# Patient Record
Sex: Male | Born: 1945
Health system: Southern US, Community
[De-identification: ages and names within clinical notes are randomized; demographics above are authoritative.]

## PROBLEM LIST (undated history)

## (undated) DIAGNOSIS — M109 Gout, unspecified: Secondary | ICD-10-CM

## (undated) DIAGNOSIS — M1990 Unspecified osteoarthritis, unspecified site: Secondary | ICD-10-CM

## (undated) DIAGNOSIS — E119 Type 2 diabetes mellitus without complications: Secondary | ICD-10-CM

## (undated) DIAGNOSIS — I6529 Occlusion and stenosis of unspecified carotid artery: Secondary | ICD-10-CM

## (undated) DIAGNOSIS — E7800 Pure hypercholesterolemia, unspecified: Secondary | ICD-10-CM

## (undated) DIAGNOSIS — I10 Essential (primary) hypertension: Secondary | ICD-10-CM

## (undated) DIAGNOSIS — K429 Umbilical hernia without obstruction or gangrene: Secondary | ICD-10-CM

## (undated) DIAGNOSIS — T8859XA Other complications of anesthesia, initial encounter: Secondary | ICD-10-CM

## (undated) DIAGNOSIS — I2510 Atherosclerotic heart disease of native coronary artery without angina pectoris: Secondary | ICD-10-CM

## (undated) DIAGNOSIS — R0600 Dyspnea, unspecified: Secondary | ICD-10-CM

## (undated) DIAGNOSIS — C801 Malignant (primary) neoplasm, unspecified: Secondary | ICD-10-CM

## (undated) DIAGNOSIS — I219 Acute myocardial infarction, unspecified: Secondary | ICD-10-CM

## (undated) DIAGNOSIS — Z872 Personal history of diseases of the skin and subcutaneous tissue: Secondary | ICD-10-CM

## (undated) DIAGNOSIS — T4145XA Adverse effect of unspecified anesthetic, initial encounter: Secondary | ICD-10-CM

## (undated) HISTORY — DX: Pure hypercholesterolemia, unspecified: E78.00

## (undated) HISTORY — DX: Gout, unspecified: M10.9

## (undated) HISTORY — PX: TOE SURGERY: SHX1073

## (undated) HISTORY — DX: Atherosclerotic heart disease of native coronary artery without angina pectoris: I25.10

## (undated) HISTORY — PX: BACK SURGERY: SHX140

## (undated) HISTORY — DX: Occlusion and stenosis of unspecified carotid artery: I65.29

## (undated) HISTORY — DX: Type 2 diabetes mellitus without complications: E11.9

## (undated) HISTORY — DX: Essential (primary) hypertension: I10

## (undated) HISTORY — PX: ROTATOR CUFF REPAIR: SHX139

## (undated) HISTORY — PX: KNEE SURGERY: SHX244

---

## 1999-02-04 ENCOUNTER — Encounter: Payer: Self-pay | Admitting: Neurosurgery

## 1999-02-04 ENCOUNTER — Observation Stay (HOSPITAL_COMMUNITY): Admission: RE | Admit: 1999-02-04 | Discharge: 1999-02-05 | Payer: Self-pay | Admitting: Neurosurgery

## 2000-08-02 ENCOUNTER — Encounter: Payer: Self-pay | Admitting: Neurosurgery

## 2000-08-02 ENCOUNTER — Ambulatory Visit (HOSPITAL_COMMUNITY): Admission: RE | Admit: 2000-08-02 | Discharge: 2000-08-02 | Payer: Self-pay | Admitting: Neurosurgery

## 2002-07-27 HISTORY — PX: CORONARY ARTERY BYPASS GRAFT: SHX141

## 2002-07-27 HISTORY — PX: BYPASS GRAFT: SHX909

## 2003-03-02 ENCOUNTER — Encounter: Payer: Self-pay | Admitting: Internal Medicine

## 2003-03-02 ENCOUNTER — Inpatient Hospital Stay (HOSPITAL_COMMUNITY): Admission: EM | Admit: 2003-03-02 | Discharge: 2003-03-11 | Payer: Self-pay | Admitting: Emergency Medicine

## 2003-03-06 ENCOUNTER — Encounter: Payer: Self-pay | Admitting: Internal Medicine

## 2003-03-07 ENCOUNTER — Encounter: Payer: Self-pay | Admitting: Cardiothoracic Surgery

## 2003-03-08 ENCOUNTER — Encounter: Payer: Self-pay | Admitting: Cardiothoracic Surgery

## 2003-03-09 ENCOUNTER — Encounter: Payer: Self-pay | Admitting: Cardiothoracic Surgery

## 2003-04-16 ENCOUNTER — Encounter (HOSPITAL_COMMUNITY): Admission: RE | Admit: 2003-04-16 | Discharge: 2003-07-15 | Payer: Self-pay | Admitting: Internal Medicine

## 2003-07-16 ENCOUNTER — Encounter (HOSPITAL_COMMUNITY): Admission: RE | Admit: 2003-07-16 | Discharge: 2003-08-27 | Payer: Self-pay | Admitting: Internal Medicine

## 2004-12-02 LAB — HM COLONOSCOPY: HM Colonoscopy: NORMAL

## 2005-06-16 ENCOUNTER — Ambulatory Visit: Payer: Self-pay | Admitting: Cardiology

## 2005-06-23 ENCOUNTER — Ambulatory Visit: Payer: Self-pay | Admitting: Cardiology

## 2005-07-23 ENCOUNTER — Ambulatory Visit: Payer: Self-pay | Admitting: Cardiology

## 2005-07-27 DIAGNOSIS — D126 Benign neoplasm of colon, unspecified: Secondary | ICD-10-CM | POA: Insufficient documentation

## 2005-08-03 ENCOUNTER — Ambulatory Visit: Payer: Self-pay | Admitting: Gastroenterology

## 2005-08-06 ENCOUNTER — Ambulatory Visit: Payer: Self-pay | Admitting: Cardiology

## 2005-08-20 ENCOUNTER — Encounter (INDEPENDENT_AMBULATORY_CARE_PROVIDER_SITE_OTHER): Payer: Self-pay | Admitting: Specialist

## 2005-08-20 ENCOUNTER — Ambulatory Visit: Payer: Self-pay | Admitting: Gastroenterology

## 2006-05-12 ENCOUNTER — Ambulatory Visit: Payer: Self-pay | Admitting: Cardiology

## 2006-05-12 ENCOUNTER — Ambulatory Visit: Payer: Self-pay

## 2007-04-08 ENCOUNTER — Encounter: Payer: Self-pay | Admitting: Internal Medicine

## 2007-07-15 ENCOUNTER — Ambulatory Visit: Payer: Self-pay

## 2007-07-15 ENCOUNTER — Ambulatory Visit: Payer: Self-pay | Admitting: Cardiology

## 2008-07-05 ENCOUNTER — Ambulatory Visit: Payer: Self-pay

## 2008-07-05 ENCOUNTER — Ambulatory Visit: Payer: Self-pay | Admitting: Cardiology

## 2008-07-10 ENCOUNTER — Ambulatory Visit: Payer: Self-pay

## 2008-08-15 ENCOUNTER — Ambulatory Visit: Payer: Self-pay | Admitting: Cardiology

## 2008-08-15 LAB — CONVERTED CEMR LAB
Bilirubin, Direct: 0.1 mg/dL (ref 0.0–0.3)
HDL: 39.7 mg/dL (ref >39.0)
Total Bilirubin: 0.9 mg/dL (ref 0.3–1.2)
Total CHOL/HDL Ratio: 2.8
VLDL: 19 mg/dL (ref 0–40)

## 2009-03-29 ENCOUNTER — Encounter: Admission: RE | Admit: 2009-03-29 | Discharge: 2009-03-29 | Payer: Self-pay | Admitting: Neurosurgery

## 2009-04-19 ENCOUNTER — Encounter: Admission: RE | Admit: 2009-04-19 | Discharge: 2009-04-19 | Payer: Self-pay | Admitting: Neurosurgery

## 2009-04-22 ENCOUNTER — Encounter (INDEPENDENT_AMBULATORY_CARE_PROVIDER_SITE_OTHER): Payer: Self-pay | Admitting: *Deleted

## 2009-08-01 ENCOUNTER — Encounter: Payer: Self-pay | Admitting: Cardiology

## 2009-08-01 DIAGNOSIS — I10 Essential (primary) hypertension: Secondary | ICD-10-CM | POA: Insufficient documentation

## 2009-08-01 DIAGNOSIS — M109 Gout, unspecified: Secondary | ICD-10-CM

## 2009-08-01 DIAGNOSIS — I2510 Atherosclerotic heart disease of native coronary artery without angina pectoris: Secondary | ICD-10-CM | POA: Insufficient documentation

## 2009-08-01 DIAGNOSIS — I679 Cerebrovascular disease, unspecified: Secondary | ICD-10-CM | POA: Insufficient documentation

## 2009-08-01 DIAGNOSIS — E785 Hyperlipidemia, unspecified: Secondary | ICD-10-CM | POA: Insufficient documentation

## 2009-08-02 ENCOUNTER — Ambulatory Visit: Payer: Self-pay

## 2009-08-02 ENCOUNTER — Encounter: Payer: Self-pay | Admitting: Cardiovascular Disease

## 2009-08-02 ENCOUNTER — Ambulatory Visit: Payer: Self-pay | Admitting: Cardiology

## 2009-09-20 ENCOUNTER — Ambulatory Visit: Payer: Self-pay | Admitting: Cardiology

## 2009-09-20 ENCOUNTER — Encounter (INDEPENDENT_AMBULATORY_CARE_PROVIDER_SITE_OTHER): Payer: Self-pay | Admitting: *Deleted

## 2009-09-20 LAB — CONVERTED CEMR LAB
BUN: 8 mg/dL (ref 6–23)
CO2: 27 meq/L (ref 19–32)
Glucose, Bld: 179 mg/dL — ABNORMAL HIGH (ref 70–99)
HDL: 45.2 mg/dL (ref >39.00)
LDL Cholesterol: 27 mg/dL (ref 0–99)
Potassium: 3.9 meq/L (ref 3.5–5.1)
Sodium: 138 meq/L (ref 135–145)
Total Bilirubin: 0.7 mg/dL (ref 0.3–1.2)
Total CHOL/HDL Ratio: 2
VLDL: 24.2 mg/dL (ref 0.0–40.0)

## 2009-09-25 ENCOUNTER — Telehealth (INDEPENDENT_AMBULATORY_CARE_PROVIDER_SITE_OTHER): Payer: Self-pay | Admitting: *Deleted

## 2009-10-04 ENCOUNTER — Encounter: Payer: Self-pay | Admitting: Cardiology

## 2010-01-17 ENCOUNTER — Telehealth (INDEPENDENT_AMBULATORY_CARE_PROVIDER_SITE_OTHER): Payer: Self-pay | Admitting: *Deleted

## 2010-04-17 ENCOUNTER — Telehealth (INDEPENDENT_AMBULATORY_CARE_PROVIDER_SITE_OTHER): Payer: Self-pay | Admitting: *Deleted

## 2010-04-18 IMAGING — CT CT L SPINE W/ CM
4 of 11 series · 10 of 33 positions shown, 12 images · IV contrast (omnipaque)
Comparison: 03/29/2009

MYELOGRAM  INJECTION
TECHNIQUE: Informed consent was obtained from the patient prior to
the procedure, including potential complications of headache,
allergy, infection and pain. Specific instructions were given
regarding 24 hour bedrest post procedure to prevent post-LP
headache.  A timeout procedure was performed.  With the patient
prone, the lower back was prepped with Betadine.  1% Lidocaine was
used for local anesthesia.  Lumbar puncture was performed by the
radiologist at the L4-5 level using a 22 gauge needle with return
of clear CSF.  15 cc of Omnipaque 180 was injected into the
subarachnoid space .
CLINICAL DATA: Low back and left leg pain.  Previous lumbar
discectomy at L4-5, right many years ago.
TECHNIQUE: Multidetector CT imaging of the lumbar spine was
performed following myelography.  Multiplanar CT image
reconstructions were also generated.

[Series 2: l spine · axial · 0.27mm/px · z∈[-182,-110]mm · 2 of 87 slices shown, 3 images]
[im 29/87  soft-tissue]
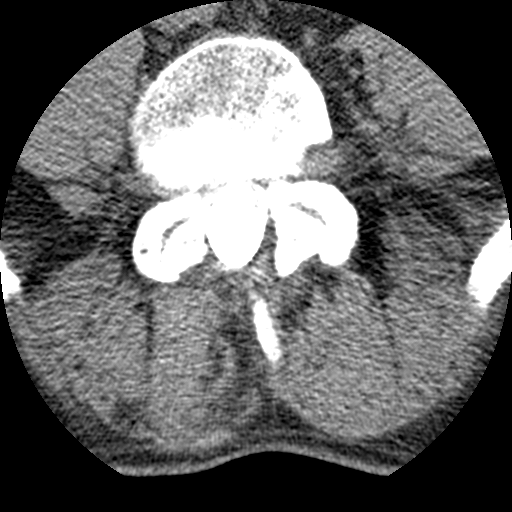
[im 29/87  bone]
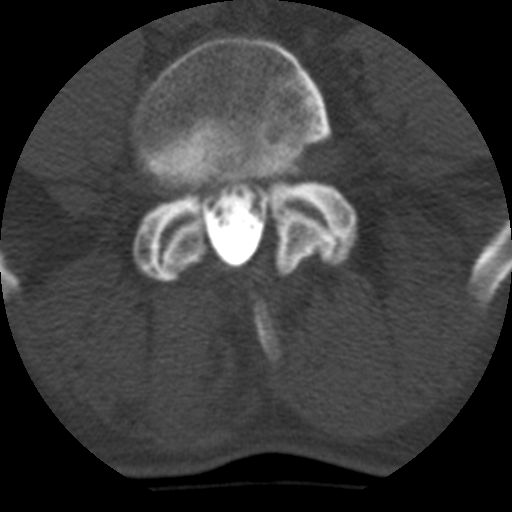
[im 58/87  bone]
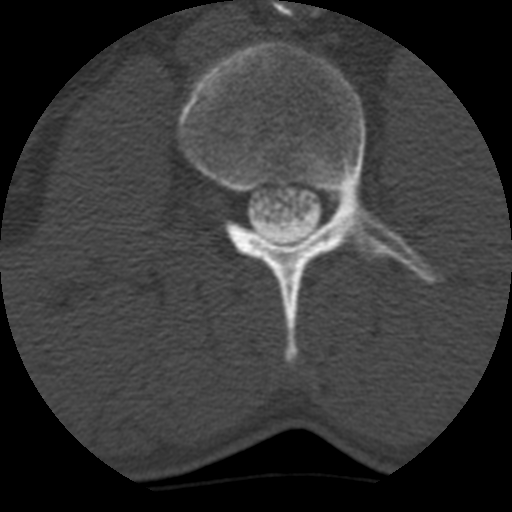

[Series 3: bone windows · axial · 0.27mm/px · z∈[-182,-110]mm · 2 of 87 slices shown]
[im 29/87  bone]
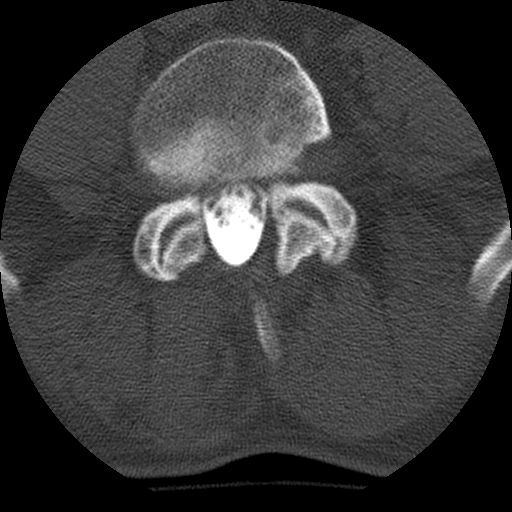
[im 58/87  bone]
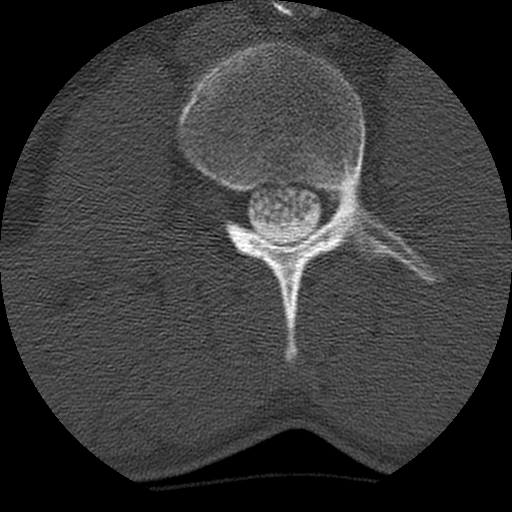

[Series 401: cor lower l-spine · coronal · 0.43mm/px · 1 of 45 slices shown]
[im 23/45  bone]
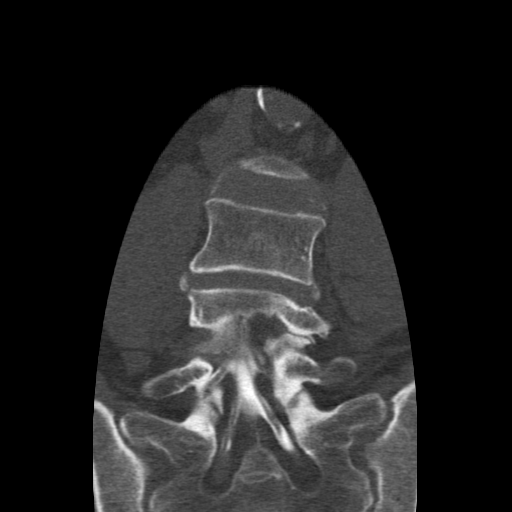

[Series 402: sag l-spine · sagittal · 0.43mm/px · 5 of 45 slices shown, 6 images]
[im 15/45  bone]
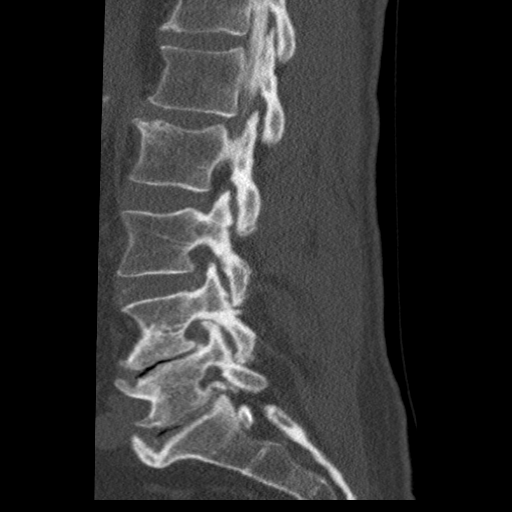
[im 19/45  bone]
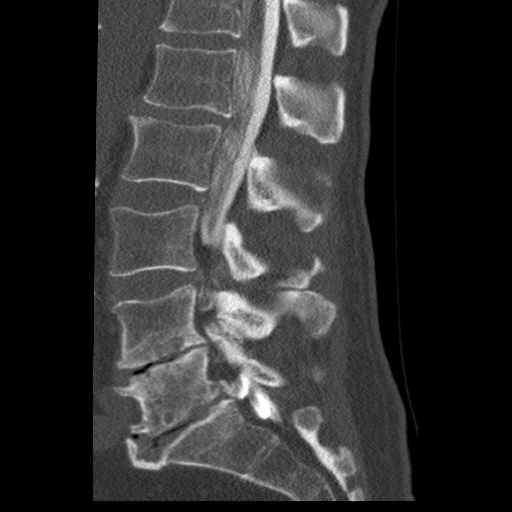
[im 23/45  soft-tissue]
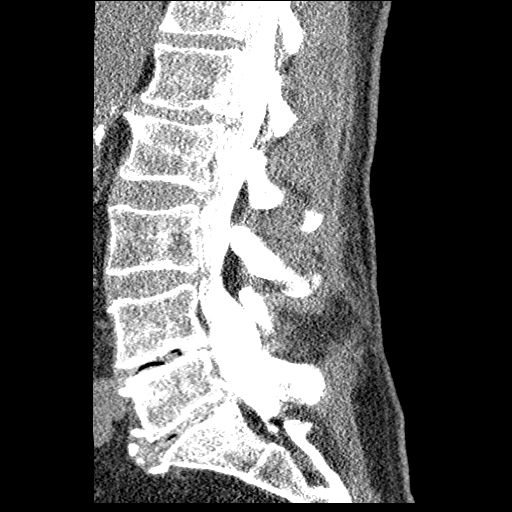
[im 23/45  bone]
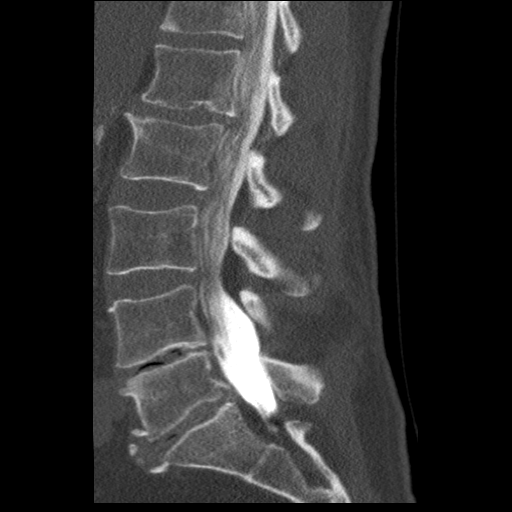
[im 26/45  bone]
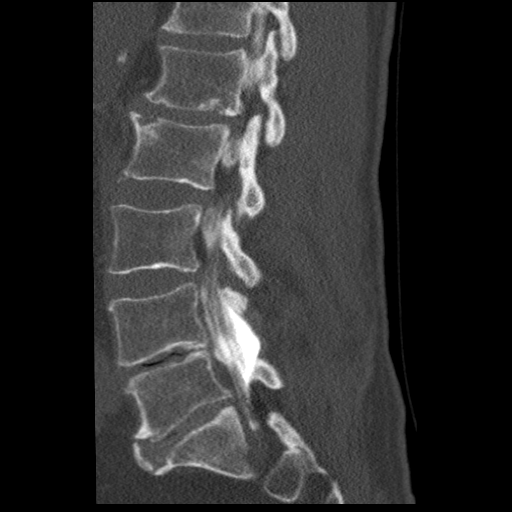
[im 30/45  bone]
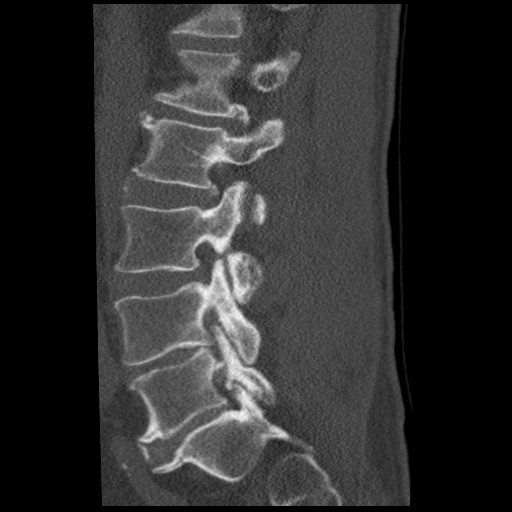

[10 of 33 positions shown; findings below may reference images not displayed]

IMPRESSION: Successful injection of  intrathecal contrast for myelography.

MYELOGRAM LUMBAR
FINDINGS: Anatomic alignment is present.  There is moderate to
severe disc space narrowing at L4-5.  Mild to moderate disc space
narrowing is seen at L1-2 and L5-S1. Conus medullaris is normal.

There is mild degenerative scoliosis convex right at the L4-5
level.  This relates to asymmetric loss of interspace height at L4-
5 on the left.  There appears to be mild lateral recess stenosis at
L4-5, left, slightly deflecting the left L5 nerve root.

Moderate to severe stenosis is present at L3-4, particularly with
the patient standing.  This relates to posterior element
hypertrophy and central disc protrusion.  Bilateral L4 nerve root
encroachment is present, worse on the right again with the patient
standing.

Standing lateral flexion/extension views show no dynamic
instability.

At the L5-S1 level, both S1 nerve roots appear to fill normally.
There is moderate posterior osseous ridging/calcific disc material
at this level.

Fluoroscopy Time: 1.22 minutes
IMPRESSION: As above.

CT MYELOGRAPHY LUMBAR SPINE
FINDINGS: No prevertebral or paraspinous masses.  Moderate
atherosclerotic calcification of the aorta.

L1-2: Shallow central protrusion with large calcified
extraforaminal disc herniation on the right.  No L2 nerve root
encroachment the canal.  The right L1 nerve root appears at risk.

L2-3: Mild annular bulging.  Mild facet arthropathy.  No
compressive lesion.

L3-4: Moderate central canal stenosis secondary to posterior
element hypertrophy and shallow central protrusion/annular bulge.
Mild bilateral neural foraminal narrowing.  Left greater than right
L4 nerve root encroachment is identified with the patient recumbent
for CT.  Myelographically the changes are worse on the right at
this level.

L4-5: Hemilaminectomy defect, right without evidence for recurrent
protrusion.  Moderate facet arthropathy, left greater than right.
Diffuse loss of disc height, worse on the left, associated with
circumferential osteophyte formation. Mild left L5 nerve root
encroachment the lateral recess.  No evidence for recurrent right-
sided disc pathology.  Bilateral neural foraminal narrowing,
slightly worse on the left, potentially affects the left L4 nerve
root.

L5-S1: Central and leftward calcific protrusion associated disc
space narrowing and facet arthropathy.  The left S1 nerve root is
not significantly compressed by this disc/osteophyte complex.
Calcific disc material does extend into the foramen where facet
arthropathy combines to result in moderate foraminal narrowing,
although clear-cut left L5 nerve root encroachment is not
established.  There is no significant foraminal narrowing on the
right.

Compared to the prior MR, there is fairly good agreement.  There is
no clear-cut dominant left-sided pathology.  The disc/osteophyte
complex L5-S1 on the left is fairly prominent but no nerve root cut
off affects the S1 nerve root either on CT or myelographically.
With the patient standing there is moderate stenosis at L3-4 but
this does not lateralize to the left myelographically.  On CT
however there is left greater than right effacement of the L4 nerve
roots with the patient recumbent.  The pathology at L4-5 affecting
the L4 and L5 nerve roots on the left is relatively modest.
IMPRESSION: Calcified central and leftward protrusion/osteophyte at L5-S1.
With regard to the clinical question, there is no evidence for left
S1 nerve root cut off.

Asymmetric foraminal narrowing at L4-5, left and L5-S1, left
potentially irritate the left L4 and left L5 nerve roots
respectively, as suggested from prior MR.

Mild asymmetric lateral recess encroachment left at L4-5.

Moderate stenosis at L3-4, particularly with the patient standing.
Bilateral L4 nerve root encroachment is present at this level,
slightly worse on the left with the patient recumbent for CT.

No dynamic instability.

## 2010-07-31 ENCOUNTER — Ambulatory Visit
Admission: RE | Admit: 2010-07-31 | Discharge: 2010-07-31 | Payer: Self-pay | Source: Home / Self Care | Attending: Cardiology | Admitting: Cardiology

## 2010-07-31 ENCOUNTER — Encounter: Payer: Self-pay | Admitting: Cardiology

## 2010-08-01 ENCOUNTER — Other Ambulatory Visit: Payer: Self-pay | Admitting: Cardiology

## 2010-08-01 ENCOUNTER — Ambulatory Visit
Admission: RE | Admit: 2010-08-01 | Discharge: 2010-08-01 | Payer: Self-pay | Source: Home / Self Care | Attending: Cardiology | Admitting: Cardiology

## 2010-08-01 DIAGNOSIS — E7800 Pure hypercholesterolemia, unspecified: Secondary | ICD-10-CM | POA: Insufficient documentation

## 2010-08-01 LAB — LIPID PANEL
Cholesterol: 173 mg/dL (ref 0–200)
HDL: 47.6 mg/dL (ref >39.00)
LDL Cholesterol: 94 mg/dL (ref 0–99)
Total CHOL/HDL Ratio: 4
Triglycerides: 158 mg/dL — ABNORMAL HIGH (ref 0.0–149.0)
VLDL: 31.6 mg/dL (ref 0.0–40.0)

## 2010-08-01 LAB — HEPATIC FUNCTION PANEL
ALT: 33 U/L (ref 0–53)
AST: 27 U/L (ref 0–37)
Albumin: 4 g/dL (ref 3.5–5.2)
Alkaline Phosphatase: 26 U/L — ABNORMAL LOW (ref 39–117)
Bilirubin, Direct: 0.2 mg/dL (ref 0.0–0.3)
Total Bilirubin: 1 mg/dL (ref 0.3–1.2)
Total Protein: 6.9 g/dL (ref 6.0–8.3)

## 2010-08-21 ENCOUNTER — Ambulatory Visit: Admission: RE | Admit: 2010-08-21 | Discharge: 2010-08-21 | Payer: Self-pay | Source: Home / Self Care

## 2010-08-21 ENCOUNTER — Encounter: Payer: Self-pay | Admitting: Cardiology

## 2010-08-26 NOTE — Progress Notes (Signed)
   Walk in Patient Form Recieved " pt left [employer] paper to be filled out" sent to Message Nurse [facility]  September 25, 2009 9:27 AM

## 2010-08-26 NOTE — Letter (Signed)
Summary: Disability Benefits Questionaire  Disability Benefits Questionaire   Imported By: Marylou Mccoy 12/27/2009 09:43:19  _____________________________________________________________________  External Attachment:    Type:   Image     Comment:   External Document

## 2010-08-26 NOTE — Assessment & Plan Note (Signed)
Summary: [id number]/fkw  Medications Added METOPROLOL SUCCINATE 50 MG XR24H-TAB (METOPROLOL SUCCINATE) Take one tablet by mouth daily AMLODIPINE BESYLATE 5 MG TABS (AMLODIPINE BESYLATE) Take one tablet by mouth daily AMLODIPINE BESYLATE 10 MG TABS (AMLODIPINE BESYLATE) Take one tablet by mouth daily TERAZOSIN HCL 5 MG CAPS (TERAZOSIN HCL) 1 tab by mouth once daily FISH OIL   OIL (FISH OIL) 2 tabs by mouth once daily PREDNISONE 5 MG TABS (PREDNISONE) 1 tab by mouth once daily * HYDROCORTI/NYSTAIN mouth wash at bedtime ASPIRIN EC 325 MG TBEC (ASPIRIN) Take one tablet by mouth daily CRESTOR 10 MG TABS (ROSUVASTATIN CALCIUM) Take one tablet by mouth daily.        CC:  pt states he has started taken prednisone that expired in 2003 or 2005 because he was having pain .  History of Present Illness: Francisco Howe is a pleasant 65 year old gentleman who has a history of coronary artery disease, status post coronary artery bypassing graft in 2004.  I last saw him on July 14, 2008.  Last Myoview was performed on July 10, 2008. There is no scar or ischemia and his ejection fraction was 59%. Left carotid Dopplers in December of 2009 showed a 0-39% right and a 40-59% left stenosis. There was a 20 mm mercury difference in blood pressure with possible right subclavian steal. Followup was recommended in one year. Abdominal ultrasound in December of 2008 showed no aneurysm. Since I last saw him he has developed significant low back and left hip and lower extremity pain. This is similar to previous disc pain. Note he denies any chest pain. He has not been able to exercise in the past 3 months and therefore does note some increased weight and dyspnea. There is no orthopnea, PND or pedal edema. There is no syncope. There is some question about whether he can have steroid injections for his back issue.  Current Medications (verified): 1)  Metoprolol Succinate 50 Mg Xr24h-Tab (Metoprolol Succinate) .... Take One Tablet By  Mouth Daily 2)  Amlodipine Besylate 5 Mg Tabs (Amlodipine Besylate) .... Take One Tablet By Mouth Daily 3)  Terazosin Hcl 5 Mg Caps (Terazosin Hcl) .Marland Kitchen.. 1 Tab By Mouth Once Daily 4)  Fish Oil   Oil (Fish Oil) .... 2 Tabs By Mouth Once Daily 5)  Prednisone 5 Mg Tabs (Prednisone) .Marland Kitchen.. 1 Tab By Mouth Once Daily 6)  Hydrocorti/nystain .... Mouth Wash At Bedtime  Past History:  Past Medical History: Reviewed history from 08/01/2009 and no changes required. Current Problems:  CAD (ICD-414.00) CEREBROVASCULAR DISEASE (ICD-437.9) HYPERLIPIDEMIA (ICD-272.4) HYPERTENSION (ICD-401.9) GOUT (ICD-274.9)  Past Surgical History: SURGICAL PROCEDURE:  Coronary artery bypass grafting x5 with the left  internal mammary artery to the diagonal coronary artery (dominant vessel of  anterior wall), left radial bypass to the first obtuse marginal, reverse  saphenous vein graft sequentially to the second obtuse marginal and distal  circumflex, reverse saphenous vein graft to the right coronary artery with  left radial artery harvesting and right endovein harvesting.  SURGEON:  Gwenith Daily. Tyrone Sage, M.D. EBG/MEDQ  D:  03/10/2003  T:  03/10/2003  Job:  [id number] History of back surgery Left shoulder arthroscopic surgery  Social History: Reviewed history from 08/01/2009 and no changes required.  The patient is married and divorced, he has one daughter;  he uses alcohol occasionally. Former tobacco use.   He is a retired Theatre stage manager.  Review of Systems       Significant problems with low back pain and left lower extremity  pain but no fevers or chills, productive cough, hemoptysis, dysphasia, odynophagia, melena, hematochezia, dysuria, hematuria, rash, seizure activity, orthopnea, PND, pedal edema, claudication. Remaining systems are negative.   Vital Signs:  Patient profile:   65 year old male Height:      72 inches Weight:      238 pounds BMI:     32.40 Pulse rate:   81 / minute Resp:     12 per  minute BP sitting:   150 / 100  (left arm)  Vitals Entered By: Francisco Howe (August 02, 2009 2:28 PM)  Physical Exam  General:  Well-developed well-nourished in no acute distress.  Skin is warm and dry.  HEENT is normal.  Neck is supple. No thyromegaly. right carotid bruit Chest is clear to auscultation with normal expansion.  Cardiovascular exam is regular rate and rhythm.  Abdominal exam nontender or distended. No masses palpated. Extremities show no edema. neuro grossly intact    EKG  Procedure date:  08/02/2009  Findings:      Sinus rhythm at a rate of 81. Left axis deviation. Poor R wave progression. No ST changes.  Impression & Recommendations:  Problem # 1:  CAD (ICD-414.00)  Patient doing well from a symptomatic standpoint. Plan continue aspirin, beta blocker and resume statin. Continue risk factor modification. There is no contraindication to injections for his back pain. If aspirin needs to be discontinued transiently then this could be performed. There is no contraindication to this.  His updated medication list for this problem includes:    Metoprolol Succinate 50 Mg Xr24h-tab (Metoprolol succinate) .Marland Kitchen... Take one tablet by mouth daily    Amlodipine Besylate 10 Mg Tabs (Amlodipine besylate) .Marland Kitchen... Take one tablet by mouth daily    Aspirin Ec 325 Mg Tbec (Aspirin) .Marland Kitchen... Take one tablet by mouth daily  Problem # 2:  CEREBROVASCULAR DISEASE (ICD-437.9) Continue aspirin and resume statin. Await followup carotid Dopplers performed today.  Problem # 3:  HYPERLIPIDEMIA (ICD-272.4)  Resume statin. He felt that his back pain Renken have been statin related but it did not improve after discontinuing. I think is more likely a disc problem. Check lipids and liver.  His updated medication list for this problem includes:    Crestor 10 Mg Tabs (Rosuvastatin calcium) .Marland Kitchen... Take one tablet by mouth daily.  Problem # 4:  HYPERTENSION (ICD-401.9)  His blood pressure is  elevated today. Increase amlodipine to 10 mg p.o. daily. Check bmet.  His updated medication list for this problem includes:    Metoprolol Succinate 50 Mg Xr24h-tab (Metoprolol succinate) .Marland Kitchen... Take one tablet by mouth daily    Amlodipine Besylate 10 Mg Tabs (Amlodipine besylate) .Marland Kitchen... Take one tablet by mouth daily    Terazosin Hcl 5 Mg Caps (Terazosin hcl) .Marland Kitchen... 1 tab by mouth once daily    Aspirin Ec 325 Mg Tbec (Aspirin) .Marland Kitchen... Take one tablet by mouth daily  Problem # 5:  GOUT (ICD-274.9)  Patient Instructions: 1)  Your physician recommends that you schedule a follow-up appointment in: ONE YEAR 2)  Your physician has recommended you make the following change in your medication: RESTART CRESTOR 10MG  ONE TABLET ONCE DAILY 3)  INCREASE AMLODIPINE TO 10MG  ONE TABLET ONCE DAILY 4)  Your physician recommends that you return for lab work in: 6 WEEKS Prescriptions: AMLODIPINE BESYLATE 10 MG TABS (AMLODIPINE BESYLATE) Take one tablet by mouth daily  #90 x 4   Entered by:   Deliah Goody, RN   Authorized by:  Ferman Hamming, MD, [facility]   Signed by:   Deliah Goody, RN on 08/02/2009   Method used:   Print then Give to Patient   RxID:   [id number] CRESTOR 10 MG TABS (ROSUVASTATIN CALCIUM) Take one tablet by mouth daily.  #90 x 4   Entered by:   Deliah Goody, RN   Authorized by:   Ferman Hamming, MD, [facility]   Signed by:   Deliah Goody, RN on 08/02/2009   Method used:   Print then Give to Patient   RxID:   [id number]

## 2010-08-26 NOTE — Letter (Signed)
Summary: Custom - Lipid  [facility], Main Office  [address]   [place], [place] [address]   Phone: [phone]  Fax: [phone]     September 20, 2009 MRN: [id number]   Francisco Howe [address] [place], [place]  [address]   Dear Mr. LUKACS,  We have reviewed your cholesterol results.  They are as follows:     Total Cholesterol:    96 (Desirable: less than 200)       HDL  Cholesterol:     45.20  (Desirable: greater than 40 for men and 50 for women)       LDL Cholesterol:       27  (Desirable: less than 100 for low risk and less than 70 for moderate to high risk)       Triglycerides:       121.0  (Desirable: less than 150)  Our recommendations include:These numbers look good. Continue on the same medicine. Sodium, potassium, kidney and Liver function are normal. Take care, Dr. Darel Hong.    Call our office at the number listed above if you have any questions.  Lowering your LDL cholesterol is important, but it is only one of a large number of "risk factors" that Sadik indicate that you are at risk for heart disease, stroke or other complications of hardening of the arteries.  Other risk factors include:   A.  Cigarette Smoking* B.  High Blood Pressure* C.  Obesity* D.   Low HDL Cholesterol (see yours above)* E.   Diabetes Mellitus (higher risk if your is uncontrolled) F.  Family history of premature heart disease G.  Previous history of stroke or cardiovascular disease    *These are risk factors YOU HAVE CONTROL OVER.  For more information, visit .  There is now evidence that lowering the TOTAL CHOLESTEROL AND LDL CHOLESTEROL can reduce the risk of heart disease.  The American Heart Association recommends the following guidelines for the treatment of elevated cholesterol:  1.  If there is now current heart disease and less than two risk factors, TOTAL CHOLESTEROL should be less than 200 and LDL CHOLESTEROL should be less than 100. 2.  If there  is current heart disease or two or more risk factors, TOTAL CHOLESTEROL should be less than 200 and LDL CHOLESTEROL should be less than 70.  A diet low in cholesterol, saturated fat, and calories is the cornerstone of treatment for elevated cholesterol.  Cessation of smoking and exercise are also important in the management of elevated cholesterol and preventing vascular disease.  Studies have shown that 30 to 60 minutes of physical activity most days can help lower blood pressure, lower cholesterol, and keep your weight at a healthy level.  Drug therapy is used when cholesterol levels do not respond to therapeutic lifestyle changes (smoking cessation, diet, and exercise) and remains unacceptably high.  If medication is started, it is important to have you levels checked periodically to evaluate the need for further treatment options.  Thank you,    [employer] Team

## 2010-08-26 NOTE — Progress Notes (Signed)
   Request recieved from Dept of [facility] sent to [facility]  April 17, 2010 9:08 AM

## 2010-08-26 NOTE — Progress Notes (Signed)
   Pt filled Out ROI, I faxed his Stress over to [facility] Medical attn Dr.Bigi  [facility]  January 17, 2010 2:29 PM

## 2010-08-26 NOTE — Miscellaneous (Signed)
Summary: Orders Update  Clinical Lists Changes  Orders: Added new Test order of Carotid Duplex (Carotid Duplex) - Signed 

## 2010-08-28 NOTE — Assessment & Plan Note (Signed)
Summary: F1Y/DM  Medications Added METOPROLOL TARTRATE 25 MG TABS (METOPROLOL TARTRATE) Take one tablet by mouth twice a day PRAVASTATIN SODIUM 20 MG TABS (PRAVASTATIN SODIUM) Take one tablet by mouth daily at bedtime METFORMIN HCL 500 MG TABS (METFORMIN HCL) 1 tab by mouth two times a day MULTIVITAMINS   TABS (MULTIPLE VITAMIN) 1 tab by mouth once daily RA ZINC 50 MG TABS (ZINC GLUCONATE) 1 tab by mouth once daily * VIT B 12 daily * COQ10 1 tab by mouth once daily CLOBETASOL PROPIONATE 0.05 % CREA (CLOBETASOL PROPIONATE) as directed TACROLIMUS 1 MG CAPS (TACROLIMUS) as directed rinse tid HYDROCODONE-ACETAMINOPHEN 5-500 MG TABS (HYDROCODONE-ACETAMINOPHEN) as needed        History of Present Illness: Mr. Francisco Howe is a pleasant 65 year old gentleman who has a history of coronary artery disease, status post coronary artery bypassing graft in 2004.  I last saw him in Jan 2011.  Last Myoview was performed on July 10, 2008. There is no scar or ischemia and his ejection fraction was 59%. Last carotid Dopplers in Jan 2011 showed a 0-39% right and a 40-59% left stenosis. Abdominal ultrasound in December of 2008 showed no aneurysm. Since I last saw him, the patient denies any dyspnea on exertion, orthopnea, PND, pedal edema, palpitations, syncope or chest pain.   Current Medications (verified): 1)  Metoprolol Succinate 50 Mg Xr24h-Tab (Metoprolol Succinate) .... Take One Tablet By Mouth Daily 2)  Amlodipine Besylate 10 Mg Tabs (Amlodipine Besylate) .... Take One Tablet By Mouth Daily 3)  Terazosin Hcl 5 Mg Caps (Terazosin Hcl) .Marland Kitchen.. 1 Tab By Mouth Once Daily 4)  Fish Oil   Oil (Fish Oil) .... 2 Tabs By Mouth Once Daily 5)  Aspirin Ec 325 Mg Tbec (Aspirin) .... Take One Tablet By Mouth Daily 6)  Pravastatin Sodium 20 Mg Tabs (Pravastatin Sodium) .... Take One Tablet By Mouth Daily At Bedtime 7)  Metformin Hcl 500 Mg Tabs (Metformin Hcl) .Marland Kitchen.. 1 Tab By Mouth Two Times A Day 8)  Multivitamins   Tabs  (Multiple Vitamin) .Marland Kitchen.. 1 Tab By Mouth Once Daily 9)  Ra Zinc 50 Mg Tabs (Zinc Gluconate) .Marland Kitchen.. 1 Tab By Mouth Once Daily 10)  Vit B 12 .... Daily 11)  Coq10 .Marland Kitchen.. 1 Tab By Mouth Once Daily 12)  Clobetasol Propionate 0.05 % Crea (Clobetasol Propionate) .... As Directed 13)  Tacrolimus 1 Mg Caps (Tacrolimus) .... As Directed Rinse Tid 14)  Hydrocodone-Acetaminophen 5-500 Mg Tabs (Hydrocodone-Acetaminophen) .... As Needed  Past History:  Past Medical History: CAD (ICD-414.00) CEREBROVASCULAR DISEASE (ICD-437.9) HYPERLIPIDEMIA (ICD-272.4) HYPERTENSION (ICD-401.9) GOUT (ICD-274.9)  Past Surgical History: Reviewed history from 08/02/2009 and no changes required. SURGICAL PROCEDURE:  Coronary artery bypass grafting x5 with the left  internal mammary artery to the diagonal coronary artery (dominant vessel of  anterior wall), left radial bypass to the first obtuse marginal, reverse  saphenous vein graft sequentially to the second obtuse marginal and distal  circumflex, reverse saphenous vein graft to the right coronary artery with  left radial artery harvesting and right endovein harvesting.  SURGEON:  Gwenith Daily. Tyrone Sage, M.D. EBG/MEDQ  D:  03/10/2003  T:  03/10/2003  Job:  [id number] History of back surgery Left shoulder arthroscopic surgery  Social History: Reviewed history from 08/02/2009 and no changes required.  The patient is married and divorced, he has one daughter;  he uses alcohol occasionally. Former tobacco use.   He is a retired Theatre stage manager.  Review of Systems       no fevers  or chills, productive cough, hemoptysis, dysphasia, odynophagia, melena, hematochezia, dysuria, hematuria, rash, seizure activity, orthopnea, PND, pedal edema, claudication. Remaining systems are negative.   Vital Signs:  Patient profile:   65 year old male Height:      72 inches Weight:      238 pounds BMI:     32.40 Pulse rate:   77 / minute Resp:     12 per minute BP sitting:   138 / 80  (left  arm)  Vitals Entered By: Kem Parkinson (July 31, 2010 2:05 PM)  Physical Exam  General:  Well-developed well-nourished in no acute distress.  Skin is warm and dry.  HEENT is normal.  Neck is supple. No thyromegaly.  Chest is clear to auscultation with normal expansion.  Cardiovascular exam is regular rate and rhythm.  Abdominal exam nontender or distended. No masses palpated. Extremities show no edema. neuro grossly intact    EKG  Procedure date:  07/31/2010  Findings:      Sinus rhythm, left axis deviation, cannot rule out prior inferior infarct.  Impression & Recommendations:  Problem # 1:  CAD (ICD-414.00)  Continue aspirin, beta blocker and statin. Plan myoview when he returns in one year. His updated medication list for this problem includes:    Metoprolol Succinate 50 Mg Xr24h-tab (Metoprolol succinate) .Marland Kitchen... Take one tablet by mouth daily    Amlodipine Besylate 10 Mg Tabs (Amlodipine besylate) .Marland Kitchen... Take one tablet by mouth daily    Aspirin Ec 325 Mg Tbec (Aspirin) .Marland Kitchen... Take one tablet by mouth daily  Problem # 2:  CEREBROVASCULAR DISEASE (ICD-437.9)  Continue aspirin and statin. Schedule followup carotid Dopplers.  Orders: Carotid Duplex (Carotid Duplex)  Problem # 3:  HYPERLIPIDEMIA (ICD-272.4)  Continue statin. Check lipids and liver. The following medications were removed from the medication list:    Crestor 10 Mg Tabs (Rosuvastatin calcium) .Marland Kitchen... Take one tablet by mouth daily. His updated medication list for this problem includes:    Pravastatin Sodium 20 Mg Tabs (Pravastatin sodium) .Marland Kitchen... Take one tablet by mouth daily at bedtime  The following medications were removed from the medication list:    Crestor 10 Mg Tabs (Rosuvastatin calcium) .Marland Kitchen... Take one tablet by mouth daily. His updated medication list for this problem includes:    Pravastatin Sodium 20 Mg Tabs (Pravastatin sodium) .Marland Kitchen... Take one tablet by mouth daily at bedtime  Problem #  4:  HYPERTENSION (ICD-401.9) Continue present blood pressure medications. Check potassium and renal function. His updated medication list for this problem includes:    Metoprolol Succinate 50 Mg Xr24h-tab (Metoprolol succinate) .Marland Kitchen... Take one tablet by mouth daily    Amlodipine Besylate 10 Mg Tabs (Amlodipine besylate) .Marland Kitchen... Take one tablet by mouth daily    Terazosin Hcl 5 Mg Caps (Terazosin hcl) .Marland Kitchen... 1 tab by mouth once daily    Aspirin Ec 325 Mg Tbec (Aspirin) .Marland Kitchen... Take one tablet by mouth daily  His updated medication list for this problem includes:    Metoprolol Tartrate 25 Mg Tabs (Metoprolol tartrate) .Marland Kitchen... Take one tablet by mouth twice a day    Amlodipine Besylate 10 Mg Tabs (Amlodipine besylate) .Marland Kitchen... Take one tablet by mouth daily    Terazosin Hcl 5 Mg Caps (Terazosin hcl) .Marland Kitchen... 1 tab by mouth once daily    Aspirin Ec 325 Mg Tbec (Aspirin) .Marland Kitchen... Take one tablet by mouth daily  Problem # 5:  GOUT (ICD-274.9)  Patient Instructions: 1)  Your physician has recommended you make the  following change in your medication: CHANGE TO METOPROLOL TARTRATE 25MG  ONE TABLET TWICE DAILY 2)  Your physician wants you to follow-up in:ONE YEAR  You will receive a reminder letter in the mail two months in advance. If you don't receive a letter, please call our office to schedule the follow-up appointment. 3)  Your physician has requested that you have a carotid duplex. This test is an ultrasound of the carotid arteries in your neck. It looks at blood flow through these arteries that supply the brain with blood. Allow one hour for this exam. There are no restrictions or special instructions. Prescriptions: METOPROLOL TARTRATE 25 MG TABS (METOPROLOL TARTRATE) Take one tablet by mouth twice a day  #60 x 12   Entered by:   Deliah Goody, RN   Authorized by:   Ferman Hamming, MD, [facility]   Signed by:   Deliah Goody, RN on 07/31/2010   Method used:   Electronically to        [facility]  (retail)       [address]       [place], [place]  [address]       Ph: [phone]       Fax: [phone]   RxID:   [id number]

## 2010-09-10 ENCOUNTER — Encounter: Payer: Self-pay | Admitting: Cardiology

## 2010-09-10 ENCOUNTER — Other Ambulatory Visit: Payer: Self-pay | Admitting: Cardiology

## 2010-09-10 ENCOUNTER — Other Ambulatory Visit (INDEPENDENT_AMBULATORY_CARE_PROVIDER_SITE_OTHER): Payer: 59

## 2010-09-10 DIAGNOSIS — Z79899 Other long term (current) drug therapy: Secondary | ICD-10-CM

## 2010-09-10 DIAGNOSIS — E785 Hyperlipidemia, unspecified: Secondary | ICD-10-CM

## 2010-09-10 LAB — HEPATIC FUNCTION PANEL
Albumin: 4 g/dL (ref 3.5–5.2)
Alkaline Phosphatase: 28 U/L — ABNORMAL LOW (ref 39–117)
Total Protein: 7 g/dL (ref 6.0–8.3)

## 2010-09-10 LAB — LIPID PANEL
Cholesterol: 152 mg/dL (ref 0–200)
HDL: 46.8 mg/dL (ref >39.00)
LDL Cholesterol: 82 mg/dL (ref 0–99)
Triglycerides: 117 mg/dL (ref 0.0–149.0)
VLDL: 23.4 mg/dL (ref 0.0–40.0)

## 2010-10-22 ENCOUNTER — Other Ambulatory Visit (INDEPENDENT_AMBULATORY_CARE_PROVIDER_SITE_OTHER): Payer: 59 | Admitting: *Deleted

## 2010-10-22 DIAGNOSIS — R0789 Other chest pain: Secondary | ICD-10-CM

## 2010-12-09 NOTE — Assessment & Plan Note (Signed)
[facility]                            CARDIOLOGY OFFICE NOTE   NAME:Andrades, GUS LITTLER                        MRN:          [id number]  DATE:07/15/2007                            DOB:          06-10-1946    Mr. Hufford is a very pleasant 65 year old gentleman who is status post  coronary artery bypass graft in 2004.  At that time, he had a LIMA to  the diagonal, a left radial bypass of the first obtuse marginal, a  saphenous vein graft sequentially to the second obtuse marginal and  distal circumflex, and a saphenous vein graft to the right coronary  artery.  His LV function is preserved.  Since I last saw him, he is  doing extremely well.  He denies any dyspnea, chest pain, palpitations,  or syncope.  There is no pedal edema.  He does have some arthralgias in  his knee.   MEDICATIONS:  1. Aspirin 325 mg p.o. daily.  2. Cozaar 50 mg p.o. daily.  3. Vytorin 10/40 1/2 p.o. daily.  4. Vitamin D, C, fish oil.  5. Metoprolol ER 50 mg p.o. daily.   PHYSICAL EXAMINATION:  Blood pressure 150/99, pulse 64.  He weighs 232  pounds.  HEENT:  Normal.  NECK:  Supple.  There is a right carotid bruit.  CHEST:  Clear.  CARDIAC:  Regular rate and rhythm.  ABDOMEN:  No tenderness.  EXTREMITIES:  No edema.   His electrocardiogram shows a sinus rhythm at a rate of 64.  A prior  anterior infarct cannot be excluded.   DIAGNOSES:  1. Coronary artery disease, status post coronary artery bypass graft:      Mr. Laguna is doing well from a symptomatic standpoint with no chest      pain or shortness of breath.  We will continue with medical      therapy, including his aspirin, beta blocker, ARB, and statin.  2. Hypertension:  His blood pressure is elevated today.  I have asked      him to increase his Cozaar to 100 mg p.o. daily.  We will check a      BMET in two weeks to follow his potassium and renal function.  3. Hyperlipidemia:  He just resumed his Vytorin and is scheduled  for      lipids and liver in approximately 2-4 weeks with Dr. Lorenz Coaster.  We      will have those forwarded to use for our records.  Our goal LDL      should be less than 70, given his history of coronary disease.  4. Cerebrovascular disease:  He had follow-up carotid Dopplers today      and the preliminary suggests no progression.  He will need followup      in one year.  5. History of gout.   He will continue with risk factor modification, including diet and  exercise.  He will see [place] back in 12 months.     Madolyn Frieze Jens Som, MD, [facility]  Electronically Signed    BSC/MedQ  DD: 07/15/2007  DT: 07/15/2007  Job #: [id number]   cc:   Reuben Likes, M.D.

## 2010-12-12 NOTE — Cardiovascular Report (Signed)
NAME:  Francisco Howe, Francisco Howe                           ACCOUNT NO.:  [id number]   MEDICAL RECORD NO.:  [id number]                   PATIENT TYPE:  INP   LOCATION:  2103                                 FACILITY:  [facility]   PHYSICIAN:  Carole Binning, M.D. [facility]         DATE OF BIRTH:  Jul 21, 1946   DATE OF PROCEDURE:  03/05/2003  DATE OF DISCHARGE:                              CARDIAC CATHETERIZATION   PROCEDURE PERFORMED:  Left heart catheterization with coronary angiography  and left ventriculography.   INDICATIONS:  [name] is a 65 year old male admitted with chest pain and  ruled in for a non Q-wave myocardial infarction.   PROCEDURAL NOTE:  A 6-French sheath was placed in the right femoral artery.  The patient had separate ostia for his left anterior descending and the left  circumflex coronary artery.  The left circumflex was visualized with a JL4  catheter.  The left anterior descending was visualized with a JL3.5  catheter.  The right coronary artery was visualized with a JR4 catheter.  Left ventriculography and abdominal aortography were performed with an  angled pigtail catheter.  Contrast was Omnipaque.  At the conclusion of  procedure a Perclose vascular closure device was placed in the right femoral  artery with good hemostasis.  There were no complications.   RESULTS:   HEMODYNAMICS:  Left ventricular pressure 130/18.  Aortic pressure 110/72.  There is no aortic valve gradient.   LEFT VENTRICULOGRAM:  Wall motion is normal.  Ejection fraction is  calculated at 65%.  There is no mitral regurgitation.   ABDOMINAL AORTOGRAM:  Abdominal aortogram reveals mild tortuosity of the  abdominal aorta and iliac arteries with no significant obstructive disease.  Renal arteries are patent.   CORONARY ARTERIOGRAPHY:  (Left dominant)  Left main is absent.  The left anterior descending and left circumflex have  separate ostia.   Left anterior descending has an 80% stenosis at its  ostium.  In the proximal  LAD there is a tubular 60% stenosis.  The LAD gives rise to a large first  diagonal branch.   Left circumflex is a large, dominant vessel.  It gives rise to a very small  OM 1, a large OM 2, a large posterolateral branch, and a large posterior  descending artery.  There is an 80% stenosis in the mid circumflex just  beyond the second obtuse marginal branch.  There is a 20% stenosis in the  distal circumflex.  Further down in the origin of the posterior descending  artery there is a 95% stenosis.   Right coronary artery is 100% occluded in the proximal vessel.  The distal  right coronary artery which appears to consist of an acute marginal branch  fills via left to right collaterals.   IMPRESSION:  1. Normal left ventricular systolic function.  2. Severe three vessel coronary artery disease as described.  The ostial     location of  the left anterior descending stenosis in conjunction with the     co-existing three vessel coronary artery disease warrants coronary artery     bypass surgery.   PLAN:  The patient will be referred for coronary artery bypass surgery.  He  will be continued on heparin and Integrilin at this time.                                               Carole Binning, M.D. [facility]    MWP/MEDQ  D:  03/05/2003  T:  03/05/2003  Job:  [id number]   cc:   Reuben Likes, M.D.  [address]  [place]  [place] [address]  Fax: [phone]   Doylene Canning. Ladona Ridgel, M.D.   Cath Lab

## 2010-12-12 NOTE — Assessment & Plan Note (Signed)
[facility]                            CARDIOLOGY OFFICE NOTE   NAME:Francisco Howe                        MRN:          [id number]  DATE:07/15/2008                            DOB:          09-22-1945    Francisco Howe is a pleasant 65 year old gentleman who has a history of  coronary artery disease, status post coronary artery bypassing graft in  2004.  I last saw him on July 15, 2007.  Since then, he denies any  dyspnea on exertion, orthopnea, PND, pedal edema, palpitations,  presyncope, or syncope.  Occasionally, when he bends over he feels a  pulling sensation in his chest.  The pain does not radiate.  It is not  pleuritic nor is that exertional.  There is no associated nausea,  vomiting, shortness breath, or diaphoresis.  It resolves after standing  up.  He otherwise does not have exertional chest pain.  He did  discontinue his Cozaar as apparently it was felt to be potentially  contributing to gum problems.   MEDICATIONS:  1. Lovaza 1 mg p.o. b.i.d.  2. Metoprolol ER 100 mg p.o. daily.  3. Crestor 10 mg p.o. daily.  4. Aspirin 325 mg p.o. daily.  5. Zinc.  6. Vitamin C.  7. Flomax.   PHYSICAL EXAMINATION:  VITAL SIGNS:  Blood pressure 155/96 and his pulse  is 68.  HEENT:  Normal.  NECK:  Supple.  There is right carotid bruit.  CHEST:  Clear.  CARDIOVASCULAR:  Regular rate.  ABDOMEN:  No tenderness.  EXTREMITIES:  No edema.   His electrocardiogram shows a sinus rhythm at a rate of 65.  There is  left axis deviation.  There is poor R- wave progression and prior  anterior infarct cannot be excluded.  There is a first-degree AV block.   DIAGNOSES:  1. Coronary artery disease, status post coronary artery bypassing      graft - Francisco Howe is doing reasonably well from symptomatic      standpoint.  His pulling sensation in his chest is not sound      cardiac, but it has been 6 years since his bypass surgery.  We will      proceed with a stress  Myoview for risk stratification.  He will      continue on his aspirin, beta-blocker, and statin.  2. Hypertension - His blood pressure is elevated today.  He will      continue off the Cozaar as this is causing a questionable gum      issue.  We will add Norvasc 5 mg p.o. daily for improved blood      pressure control.  3. Hyperlipidemia - The patient did not tolerate Lipitor and is now      off Vytorin due to myalgias.  He is now on Crestor and we will see      if he tolerates this.  I will check lipids and liver in 6 weeks.  4. Cerebrovascular disease- He will need followup carotid Dopplers in      1 year.  5. History of  gout.   He will continue risk factor modification including diet and exercise.  He does not smoke.  I will see him back in 1 year.     Francisco Howe Francisco Som, MD, [facility]  Electronically Signed    BSC/MedQ  DD: 07/05/2008  DT: 07/05/2008  Job #: [id number]   cc:   Francisco Howe, M.D.

## 2010-12-12 NOTE — Assessment & Plan Note (Signed)
[facility]                              CARDIOLOGY OFFICE NOTE   NAME:Francisco Howe, Francisco Howe                        MRN:          [id number]  DATE:05/12/2006                            DOB:          02/24/1946    Francisco Howe is a very pleasant 65 year old gentleman who is past post coronary  bypass graft.  Since I last saw him, he is doing well.  There is no dyspnea  on exertion, orthopnea, PND, pedal edema, palpitations, presyncope, syncope  or chest pain.   MEDICATIONS:  1. Aspirin 325 mg p.o. daily.  2. Toprol 25 mg p.o. daily.  3. Cozaar 50 mg p.o. daily.  4. Vitamins.  5. Vytorin 10/40 mg tablets 1/2 p.o. daily.  6. Fish oil.   PHYSICAL EXAMINATION:  VITAL SIGNS:  Blood pressure of 144/84 and his pulse  is 70.  Weight is 224 pounds.  NECK:  Is supple.  CHEST:  Is clear.  CARDIOVASCULAR:  Regular rate and rhythm.  ABDOMEN:  Shows no pulsatile masses but there was a soft midabdominal bruit.  EXTREMITIES:  Showed no edema.   His electrocardiogram shows a normal sinus rhythm at a rate of 72. There is  left axis deviation.  There are nonspecific ST changes.   DIAGNOSES:  1. Coronary artery disease, status post coronary artery bypass graft.  2. Hypertension.  3. Hyperlipidemia.  4. History of gout.   PLAN:  Francisco Howe is doing well from a symptomatic standpoint.  His blood  pressure is mildly elevated and I have asked him to increase his Cozaar to  100 mg p.o. daily.  We will check a BMET in 1 week to follow his potassium  and renal function.  He is due for his follow-up carotid Dopplers for his  cerebrovascular disease.  We will arrange that and also an ultrasound of his  abdomen if there is a midabdominal bruit as well.  He will continue with  risk factor modification including diet and exercise.  He does not smoke.  He will see [place] back in 12 months.            ______________________________  Madolyn Frieze. Jens Som, MD, [facility]     BSC/MedQ  DD:   05/12/2006  DT:  05/13/2006  Job #:  [id number]   cc:   Reuben Likes, M.D.

## 2010-12-12 NOTE — H&P (Signed)
NAME:  Francisco Howe, Francisco Howe                           ACCOUNT NO.:  [id number]   MEDICAL RECORD NO.:  [id number]                   PATIENT TYPE:  INP   LOCATION:  1827                                 FACILITY:  [facility]   PHYSICIAN:  Doylene Canning. Ladona Ridgel, M.D.               DATE OF BIRTH:  01/02/1946   DATE OF ADMISSION:  03/02/2003  DATE OF DISCHARGE:                                HISTORY & PHYSICAL   CARDIOLOGY ADMISSION NOTE:   ADMISSION DIAGNOSIS:  Recent out of hospital myocardial infarction with  acute coronary syndrome.   HISTORY OF PRESENT ILLNESS:  The patient is a very pleasant 65 year old man  with hypertension and borderline diabetes who was in his usual state of  health until yesterday when while doing some heavy vigorous work he noted  dull midsternal chest pain which occurred while he was trimming tree limbs  and mowing the grass.  It was associated with shortness of breath,  diaphoresis and nausea.  It radiated to the mid scapula.  It eventually  resolved after he took several aspirin.  The patient was awakened this  morning with indigestion and persistent scapular pain.  He took some more  nonsteroidals antiinflammatory drugs and then went back to sleep.  He saw  his primary physician today and at that time had just a nagging sensation in  his left shoulder and he was referred over for hospitalization.  Subsequent  cardiac enzymes demonstrate positive troponins and CK-MBs with a CK-MB which  was initially 78, now 60 and a troponin of 15, now 13.  He is admitted for  additional evaluation and treatment.   PAST MEDICAL HISTORY:  1. As previously noted.  2. He also has a history of hyperlipidemia.   SOCIAL HISTORY:  The patient is married and divorced, he has one daughter;  he uses alcohol occasionally. He has a history of tobacco abuse, then  stopped for 20 years but over the last several years has smoked  intermittently when he drinks alcohol.  He is a retired Marine scientist.   FAMILY HISTORY:  Notable for a mother who had congestive heart failure and a  father who died in an motor vehicle accident at 28.  He had one sister who  died of an aneurysm.   REVIEW OF SYSTEMS:  GENERAL: Notable for no fevers, chills or night sweats.  HEENT: He has no vision or hearing problems.  CARDIOPULMONARY: He denies any  chest pain or shortness of breath, paroxysmal nocturnal dyspnea or orthopnea  except as previously noted.  He denies claudication or cough.  GU: He denies  dysuria, hematuria or nocturia. NEUROLOGIC: He denies weakness, numbness or  history of depression.  RHEUMATOLOGIC: He denies arthralgias or joint  swelling.  GI: He denies nausea, vomiting, diarrhea or constipation.  ENDOCRINE: He denies polyuria or polydipsia.  The rest of his review of  systems is all  negative.   PHYSICAL EXAMINATION:  GENERAL: Demonstrates a pleasant, well-appearing  middle aged man in no distress.  VITAL SIGNS: His blood pressure is 153/95, pulse was 77 and regular,  respirations were 20.  HEENT: Normocephalic, atraumatic.  Pupils are equal, round.  The oropharynx  is moist.  Sclerae are anicteric.  NECK: Revealed no jugular venous distension, there were no carotid bruits.  Thyroid is not appreciably enlarged and the trachea was midline.  CARDIOVASCULAR: Revealed regular rate and rhythm with normal S1 and S2.  There was an S4 gallop.  LUNGS: Clear bilaterally to auscultation.  There are no wheezes, rales or  rhonchi.  ABDOMEN: Soft, nontender, nondistended, there is no organomegaly.  EXTREMITIES: Demonstrated no cyanosis, clubbing or edema.  The pulses were  2+ and symmetric.  NEUROLOGIC: Alert and oriented x3 with Cranial nerves II-XII intact.  The  strength is 5 out of 5 and symmetric.   EKG demonstrates normal sinus rhythm with delayed R wave transition and Q  waves in leads III and aVF consistent with prior myocardial infarction.   LABS:  Demonstrate what is  previously noted.   IMPRESSION:  1. Out of hospital myocardial infarction occurring approximately 24 to 30     hours ago.  2. Hypertension.  3. Borderline diabetes.  4. Hyperlipidemia.  5. Tobacco use.   DISCUSSION:  Will plan to admit the patient to the hospital and treat him  with aspirin, beta blockers, nitrates, heparin and Integrilin.  Will obtain  serial electrocardiograms and cardiac enzymes. Will proceed with left heart  catheterization on Monday, sooner if he has recurrent pain.                                                Doylene Canning. Ladona Ridgel, M.D.    GWT/MEDQ  D:  03/02/2003  T:  03/03/2003  Job:  [id number]   cc:   Reuben Likes, M.D.  [address]  [place]  [place] [address]  Fax: [phone]

## 2010-12-12 NOTE — Discharge Summary (Signed)
NAME:  Francisco Howe, Francisco Howe                           ACCOUNT NO.:  [id number]   MEDICAL RECORD NO.:  [id number]                   PATIENT TYPE:  INP   LOCATION:  2010                                 FACILITY:  [facility]   PHYSICIAN:  Gwenith Daily. Tyrone Sage, M.D.            DATE OF BIRTH:  06-Sep-1945   DATE OF ADMISSION:  03/02/2003  DATE OF DISCHARGE:  03/11/2003                                 DISCHARGE SUMMARY   DATE OF SURGERY:  March 07, 2003.   ADMITTING DIAGNOSIS:  Acute coronary syndrome, status post out-of-hospital  myocardial infarction.   PAST MEDICAL HISTORY:  1. Hypertension.  2. Borderline diabetes.  3. Tobacco use.  4. Gout.   SURGERY HISTORY:  1. Left shoulder arthroscopic surgery.  2. Lumbar microdiskectomy.   ALLERGIES:  This patient has no known drug allergies.   DISCHARGE DIAGNOSIS:  Significant three-vessel coronary artery disease,  status post non-Q-wave myocardial infarction, status post coronary artery  bypass grafting.   BRIEF HISTORY:  Francisco Howe is a 65 year old Caucasian man.  He was in his usual  state of good health until March 01, 2003, when he experienced some dull  midsternal chest pain while doing some heavy yard work.  His pain resolved  after several aspirin.  He awoke on the morning of March 02, 2003, with  indigestion and persistent scapular pain.  He was seen by his primary care  Sarafina Puthoff, Dr. Lorenz Coaster, and was directed to [facility] Emergency Department.  Cardiac enzymes were returned as positive, with a CK-MB 78 and troponin of  15.   HOSPITAL COURSE:  On March 02, 2003, Francisco Howe was admitted to [facility] under the care of the [facility] Cardiology service.  He was evaluated  by Dr. Gilman Schmidt and diagnosed with acute coronary syndrome.  Dr.  Lubertha Basque plan was to admit him to the telemetry unit, serial EKGs and  enzymes.  Also start aspirin, beta blocker, nitrates, heparin, and  Integrilin.  He was scheduled for a cardiac  catheterization on Monday,  August, 9, 2004.  Francisco Howe remained stable over the weekend and underwent  cardiac catheterization on the morning of March 05, 2003.  The results of  the catheterization include normal LV function with ejection fraction  calculated to be 65% and three-vessel coronary artery disease.  His lesions  were not amenable to PCI, therefore, cardiac surgery consult was requested.  He was evaluated later in the day by Dr. Sheliah Plane.  After examination  of the patient and review of the available records including the  catheterization films, Dr. Tyrone Sage agreed that surgical intervention was  the preferred treatment choice for this gentleman.  The procedure, risks,  and benefits were all discussed with Francisco Howe, and he agreed to proceed with  surgery.   Preoperative arterial evaluation on March 06, 2003, revealed no significant  carotid artery disease.  His upper extremity Allen's  test was normal in both  hands.  His lower extremity evaluation revealed ankle brachial indices of  greater than 1 bilaterally.   On March 07, 2003, Francisco Howe underwent the following surgical procedure with  Dr. Sheliah Plane:  Coronary artery bypass grafting x5.  Grafts placed at  the time of procedure:  Left internal mammary artery grafted to the diagonal  artery, left radial artery grafted to the first obtuse marginal artery,  saphenous vein grafted to the right coronary artery, saphenous vein grafted  in a sequential fashion to the second and third obtuse marginal arteries.  Vein was harvested from the right thigh via the endovein harvesting  technique.  Francisco Howe tolerated the procedure well, transferring in stable  condition to the SICU.  He remained hemodynamically stable in the immediate  postoperative period and was extubated several hours after arrival in the  intensive care unit.  Francisco Howe postoperative course has been uneventful.  He was transferred out of the intensive care  unit on postoperative day #1.  He is making very good progress in recovering from his surgery.   On the morning of March 10, 2003, postoperative day #3, Francisco Howe reports  feeling very well.  His vital signs were stable with blood pressure 94/75,  he was afebrile, and his room air saturation was 94%.  His heart is  maintaining normal sinus rhythm at 94 beats per minute.  His lungs are clear  to auscultation.  He is tolerating his diet.  His bowel and bladder function  is within normal limits for him.  His incisions are all healing well;  specifically, his left hand is neurovascularly intact.  He has no lower  extremity edema.  He is ambulating independently.  His pain is well  controlled.  Francisco Howe is making excellent progress after his surgery.  If he  continues in this manner, it is expected he will be ready for discharge home  tomorrow, March 11, 2003.   LABORATORY STUDIES:  March 09, 2003, CBC:  White blood count 9.5,  hemoglobin 11, hematocrit 31, platelets 135.  Chemistries included sodium at  133, potassium 3.8, BUN 12, creatinine 1, glucose 129.  His admitting  hemoglobin A1C was 5.9.   CONDITION ON DISCHARGE:  Improved.   INSTRUCTIONS ON DISCHARGE:   MEDICATIONS:  1. Enteric-coated aspirin 325 mg p.o. daily.  2. Toprol-XL 25 mg p.o. daily.  3. Isosorbide mononitrate 30 mg p.o. daily for one month.  4. Altace 2.5 mg daily.  5. Zocor 20 mg daily.  6. He is instructed to resume his home medications of:     A. Allopurinol 300 mg daily.     B. Colchicine p.r.n. for gout symptoms.  7. For pain management he Fales have Tylox 1-2 p.o. q.4h. p.r.n. for moderate     to severe pain or Tylenol 325 mg 1-2 p.o. q.4-6h. for mild pain.   ACTIVITY:  He has been asked to refrain from any driving or any heavy  lifting, pushing, or pulling.  He has also been instructed to continue his  breathing exercises and daily walking.  DIET:  His diet be a heart-healthy diet.   WOUND CARE:  He is  to shower daily with mild soap and water.  If his  incisions show any signs of infection or if he has a fever of greater than  101 degrees Fahrenheit, he is to call Dr. Dennie Maizes office.   FOLLOW-UP:  1. Dr. Jens Som would like to see him  in his office on March 26, 2003, at     10 a.m.  He will have a chest x-ray that     day.  2. Dr. Tyrone Sage would like to see him at the [facility] office in approximately     three weeks.  The office will call with the date and time for that     appointment.      Toribio Harbour, N.P.                  Gwenith Daily Tyrone Sage, M.D.    CTK/MEDQ  D:  03/10/2003  T:  03/11/2003  Job:  [id number]   cc:   Olga Millers, M.D.   Reuben Likes, M.D.  [address]  [place]  [place] [address]  Fax: [phone]

## 2010-12-12 NOTE — Op Note (Signed)
NAME:  Francisco Howe, Francisco Howe                           ACCOUNT NO.:  [id number]   MEDICAL RECORD NO.:  [id number]                   PATIENT TYPE:  INP   LOCATION:  2010                                 FACILITY:  [facility]   PHYSICIAN:  Francisco Howe. Francisco Howe, M.D.            DATE OF BIRTH:  Jun 29, 1946   DATE OF PROCEDURE:  03/07/2003  DATE OF DISCHARGE:                                 OPERATIVE REPORT   PREOPERATIVE DIAGNOSIS:  Coronary occlusive disease with acute inferior  myocardial infarction.   POSTOPERATIVE DIAGNOSIS:  Coronary occlusive disease with acute inferior  myocardial infarction.   SURGICAL PROCEDURE:  Coronary artery bypass grafting x5 with the left  internal mammary artery to the diagonal coronary artery (dominant vessel of  anterior wall), left radial bypass to the first obtuse marginal, reverse  saphenous vein graft sequentially to the second obtuse marginal and distal  circumflex, reverse saphenous vein graft to the right coronary artery with  left radial artery harvesting and right endovein harvesting.   SURGEON:  Francisco Howe. Francisco Howe, M.D.   FIRST ASSISTANT:  Rowe Clack, P.A.-C.   BRIEF HISTORY:  The patient is a 65 year old male who presented late after  out of hospital acute myocardial infarction.  At the time of admission, CK-  MBs were elevated with inferior evidence of ischemic changes.  Because of  the patient's symptoms and enzyme elevation, cardiac catheterization was  performed, which demonstrated a very short left main system with a high  grade greater than 80% stenosis of the ostium of the LAD.  The LAD gave off  a very diagonal branch that was the dominant supplier of the anterior wall  and a very small distal LAD branch.  For this reason without intervening  disease, it was elected to place the mammary artery to the larger diagonal  vessel than the LAD proper.  He also had a total occlusion of a small  nondominant right coronary artery, 80% stenosis of the  first obtuse marginal  and 90% stenosis of the second obtuse marginal and an 80% of the circumflex  after the takeoff of the first obtuse marginal.  Because of the patient's  significant three vessel disease, coronary artery bypass grafting was  recommended.  The patient agreed and signed informed consent.  Use of the  radial artery was also discussed with him with negative Allen's test on the  nondominant hand.   DESCRIPTION OF PROCEDURE:  With Swan-Ganz and arterial line monitors in  place, the patient underwent a general endotracheal anesthesia without  incident.  The skin of the chest, legs and left arm were prepped with  Betadine and draped in the usual sterile manner.  An incision was made on  the volar surface of the left arm over the distal radial artery.  The radial  artery appeared to be a suitable vessel.  An incision was made and used  along the radial  artery which was harvested in the standard fashion using  the harmonic scalpel to divide the small side branches and small clips for  the larger side branches.  The vessel proximal and distal ends were suture  ligated.  The radial artery was flushed with papaverine and heparinized  saline solution and appeared to be of good quality and caliber.  Prior to  removal of the radial artery, there was adequate collateral flow.  The  incision was then closed in the subcutaneous tissue with running 2-0 Vicryl  suture and skin staples in the skin edges.  Dry dressings were applied to  this and the arm was tucked.  Using the Guidant endovein harvesting system,  a segment of vein was harvested from the right thigh and was of good quality  and caliber.  Median sternotomy was performed.  The left internal mammary  artery was dissected down as a pedicle graft.  The distal artery was divided  and had good free flow.  The pericardium was opened.  The patient was  systemically heparinized. The ascending aorta and the right atrium  cannulated.   Then in the aortic root, cardioplegia needle was introduced  into the ascending aorta.  The patient was placed on cardiopulmonary bypass  2.4 liters per minute per m sq.  Sites of anastomoses were selected and  dissected out of the epicardium.  As noted above, the patient's dominant  vessel on the anterior wall was a large diagonal branch with a very small  LAD proper.  For this reason, it was selected to place the mammary artery to  the diagonal rather than the mammary artery to the LAD proper.  Aortic cross  clamp was applied and 500 mL of cold blood potassium cardioplegia was  administered with diastolic arrest of the heart.  Myocardial septal  temperature was monitored throughout the cross-clamp.  Attention was turned  first to the second obtuse marginal and the distal circumflex.  Each of  these vessels was opened and admitted a 1.5 mm probe.  The vessels were of  good quality.  Using a diamond type side-to-side anastomosis, a segment of  reverse saphenous vein graft was anastomosed to the second obtuse marginal.  The distal extent of the same vein was then carried and anastomosed to the  distal circumflex.  Additional cold blood cardioplegia was administered down  the vein graft.  Attention was then turned to the first obtuse marginal  which was opened and admitted a 1.5 mm probe.  Using a running 8-0 Prolene,  the radial artery was anastomosed to the first obtuse marginal.  Attention  was then turned to the very small distal right vessel which was totally  occluded proximally.  The vessel was opened and admitted a 1 mm probe.  The  vessel itself was approximately 1.2 to 1.3 mm in size.  This did supply the  area of inferior infarct.  Using a running 7-0 Prolene, a distal anastomosis  was performed with a segment of reverse saphenous vein graft.  Attention was  then turned to the diagonal coronary artery which was opened and admitted a 1.5 mm probe.  Using a running 7-0 Prolene a  distal anastomosis was  performed.  Using running 8-0 Prolene, the distal anastomosis from the left  internal mammary to the diagonal was carried out.  With release of the  Edwards bulldog on the mammary vessel, there was appropriate rise in the  myocardial septal temperature.  The aortic cross clamp was removed.  Total  cross clamp time was 69 minutes.  A partial occlusion clamp was placed on  the ascending aorta.  Two punch aortotomies were performed.  Each of the two  vein grafts were anastomosed to the ascending aorta.  The foot of vein graft  on the left side of the aorta supplying the distal circumflex was opened and  the radial artery was trimmed and anastomosed to the hood of the vein graft.  Two graft markers were placed.  As noted, the radial artery arises from the  hood of the vein graft to the second obtuse marginal and distal circumflex.  Air was evacuated from the grafts, and partial occlusion clamp was removed.  Sites of anastomosis were inspected and free of bleeding.  The patient was  then ventilated and weaned from cardiopulmonary bypass without difficulty.  He remained hemodynamically stable.  He was decannulated in the usual  fashion.  Protamine sulfate was administered.  With the operative field  hemostatic, two atrial and two ventricular pacing wires were applied.  Graft  markers were applied.  The left pleural tube and two mediastinal tubes were  left in place.  The sternum was closed with #6 stainless steel wire.  The  fascia was closed with interrupted 0 Vicryl, running 3-0 Vicryl in the  subcutaneous tissue, 4-0 subcuticular stitch in the skin edges.  Dry  dressings were applied.  Sponge and needle counts were reported as correct  at the completion of the procedure.  The patient tolerated the procedure  without obvious complication and was transferred to the surgical intensive  care unit for further postoperative care.                                                Francisco Howe Francisco Howe, M.D.    EBG/MEDQ  D:  03/10/2003  T:  03/10/2003  Job:  [id number]   cc:   Olga Millers, M.D.

## 2011-09-15 ENCOUNTER — Encounter: Payer: Self-pay | Admitting: Gastroenterology

## 2012-01-15 DIAGNOSIS — M19029 Primary osteoarthritis, unspecified elbow: Secondary | ICD-10-CM | POA: Diagnosis not present

## 2012-01-29 DIAGNOSIS — L438 Other lichen planus: Secondary | ICD-10-CM | POA: Insufficient documentation

## 2012-01-29 DIAGNOSIS — E119 Type 2 diabetes mellitus without complications: Secondary | ICD-10-CM | POA: Insufficient documentation

## 2012-01-29 DIAGNOSIS — L280 Lichen simplex chronicus: Secondary | ICD-10-CM | POA: Diagnosis not present

## 2012-02-26 DIAGNOSIS — M25569 Pain in unspecified knee: Secondary | ICD-10-CM | POA: Diagnosis not present

## 2012-04-11 ENCOUNTER — Ambulatory Visit (INDEPENDENT_AMBULATORY_CARE_PROVIDER_SITE_OTHER): Payer: Medicare Other | Admitting: General Surgery

## 2012-04-11 ENCOUNTER — Encounter (INDEPENDENT_AMBULATORY_CARE_PROVIDER_SITE_OTHER): Payer: Self-pay | Admitting: General Surgery

## 2012-04-11 VITALS — BP 120/86 | HR 88 | Temp 97.4°F | Resp 18 | Ht 72.0 in | Wt 225.0 lb

## 2012-04-11 DIAGNOSIS — K429 Umbilical hernia without obstruction or gangrene: Secondary | ICD-10-CM | POA: Diagnosis not present

## 2012-04-11 NOTE — Progress Notes (Signed)
Patient ID: Francisco Howe, male   DOB: 11-18-1945, 66 y.o.   MRN: [id number]  Chief Complaint  Patient presents with  . Umbilical Hernia    HPI Francisco Howe is a 66 y.o. male.   HPI This is a 66 year old with a history of diabetes and coronary artery disease status post a coronary artery bypass in 2004 who presents with a couple month history of an increasing in size umbilical hernia. This always reduces. This is not really bother him except that it is present right now and it does feel like it is getting a little bit larger. Heavy activity makes it come out a little bit more. He is no changes in his bowel movements are normal.  Past Medical History  Diagnosis Date  . Diabetes mellitus   . Hypertension   . Hypercholesteremia   . CAD (coronary artery disease)     Past Surgical History  Procedure Date  . Bypass graft 2004    x5  . Back surgery   . Knee surgery   . Toe surgery     gout  . Rotator cuff repair     left    Family History  Problem Relation Age of Onset  . Heart disease Mother   . Addison's disease Sister     Social History History  Substance Use Topics  . Smoking status: Former Smoker    Start date: 04/11/1982  . Smokeless tobacco: Not on file  . Alcohol Use: No    No Known Allergies  Current Outpatient Prescriptions  Medication Sig Dispense Refill  . allopurinol (ZYLOPRIM) 300 MG tablet Take 300 mg by mouth daily.      Marland Kitchen amLODipine (NORVASC) 10 MG tablet Take 10 mg by mouth daily.      Marland Kitchen aspirin 325 MG EC tablet Take 325 mg by mouth daily.      . Cholecalciferol (VITAMIN D3) 3000 UNITS TABS Take 1,000 Units by mouth.      . fish oil-omega-3 fatty acids 1000 MG capsule Take 2 g by mouth daily.      . metFORMIN (GLUCOPHAGE) 500 MG tablet Take 500 mg by mouth 2 (two) times daily with a meal.      . metoprolol succinate (TOPROL-XL) 50 MG 24 hr tablet Take 50 mg by mouth daily. Take with or immediately following a meal.      . Multiple Vitamin  (MULTIVITAMIN) tablet Take 1 tablet by mouth daily.      . pravastatin (PRAVACHOL) 20 MG tablet Take 20 mg by mouth daily.      . tacrolimus (PROGRAF) 1 MG capsule Take 1 mg by mouth 2 (two) times daily.      Marland Kitchen terazosin (HYTRIN) 5 MG capsule Take 5 mg by mouth at bedtime.        Review of Systems Review of Systems  Constitutional: Negative for fever, chills and unexpected weight change.  HENT: Negative for hearing loss, congestion, sore throat, trouble swallowing and voice change.   Eyes: Negative for visual disturbance.  Respiratory: Negative for cough and wheezing.   Cardiovascular: Negative for chest pain, palpitations and leg swelling.  Gastrointestinal: Negative for nausea, vomiting, abdominal pain, diarrhea, constipation, blood in stool, abdominal distention, anal bleeding and rectal pain.  Genitourinary: Negative for hematuria and difficulty urinating.  Musculoskeletal: Positive for arthralgias.  Skin: Negative for rash and wound.  Neurological: Negative for seizures, syncope, weakness and headaches.  Hematological: Negative for adenopathy. Does not bruise/bleed easily.  Psychiatric/Behavioral:  Negative for confusion.    Blood pressure 120/86, pulse 88, temperature 97.4 F (36.3 C), temperature source Temporal, resp. rate 18, height 6' (1.829 m), weight 225 lb (102.059 kg).  Physical Exam Physical Exam  Vitals reviewed. Constitutional: He appears well-developed and well-nourished.  Cardiovascular: Normal rate, regular rhythm and normal heart sounds.   Pulmonary/Chest: Effort normal and breath sounds normal. He has no wheezes. He has no rales.  Abdominal: Soft. Bowel sounds are normal. There is no tenderness. A hernia (reducible umbilical hernia nontender) is present.   Assessment    Umbilical hernia   Plan   Open umbilical hernia repair with mesh after evaluation by cardiology. Should fix sometime in near future as this is getting more symptomatic.  He is also due to  have knee replacement with Dr. Charlann Boxer. We discussed  repair.  We discussed both laparoscopic and open umbilical hernia repairs. I described the procedure in detail.   Goals should be achieved with surgery. We discussed the usage of mesh and the rationale behind that. We went over the pathophysiology of umbilical hernia. We have elected to perform open umbilical hernia repair with mesh.  We discussed the risks including bleeding, infection, recurrence, postoperative pain.         [name] 04/11/2012, 9:27 AM

## 2012-04-13 ENCOUNTER — Encounter (INDEPENDENT_AMBULATORY_CARE_PROVIDER_SITE_OTHER): Payer: Self-pay

## 2012-04-14 ENCOUNTER — Telehealth: Payer: Self-pay | Admitting: *Deleted

## 2012-04-14 NOTE — Telephone Encounter (Signed)
Phoned pt and advised he has an appoint with s weaver on mon. 9/30 at 11:30 AM- pt agrees to be here

## 2012-04-14 NOTE — Telephone Encounter (Signed)
LMTCB

## 2012-04-25 ENCOUNTER — Ambulatory Visit (INDEPENDENT_AMBULATORY_CARE_PROVIDER_SITE_OTHER): Payer: Medicare Other | Admitting: Physician Assistant

## 2012-04-25 ENCOUNTER — Encounter: Payer: Self-pay | Admitting: Physician Assistant

## 2012-04-25 VITALS — BP 138/72 | HR 70 | Resp 18 | Ht 72.0 in | Wt 224.0 lb

## 2012-04-25 DIAGNOSIS — E785 Hyperlipidemia, unspecified: Secondary | ICD-10-CM

## 2012-04-25 DIAGNOSIS — I25810 Atherosclerosis of coronary artery bypass graft(s) without angina pectoris: Secondary | ICD-10-CM | POA: Diagnosis not present

## 2012-04-25 DIAGNOSIS — I10 Essential (primary) hypertension: Secondary | ICD-10-CM

## 2012-04-25 DIAGNOSIS — Z01810 Encounter for preprocedural cardiovascular examination: Secondary | ICD-10-CM

## 2012-04-25 DIAGNOSIS — I6529 Occlusion and stenosis of unspecified carotid artery: Secondary | ICD-10-CM

## 2012-04-25 NOTE — Progress Notes (Signed)
[address] [place], [place]  [address] Phone: [phone] Fax:  [phone]  Date:  04/25/2012   Name:  Francisco Howe   DOB:  [birth year]   MRN:  [id number]  PCP:  Leo Grosser, MD  Primary Cardiologist:  Dr. Olga Millers  Primary Electrophysiologist:  None    History of Present Illness: Francisco Howe is a 66 y.o. male who returns for surgical clearance.    He has a history of CAD, s/p OOH MI and subsequent CABG in 2004, DM2, HTN, HL. Myoview 12/09: No scar or ischemia, EF 59%. Carotid Dopplers 1/12: RICA 0-39%, LICA 40-59% (f/u in 07/2012). Last seen by Dr. Jens Som 1/12.  Followup Myoview recommended in 07/2011. Patient is umbilical hernia repair.  Surgeon is Dr. Dwain Sarna.  He also needs upcoming knee replacements on both sides at some point.  Surgeon is Dr. Charlann Boxer.  He is extremely frustrated today. States he waited over an hour. Appointment was 11:30 and I walked in the room at 12:15.  States he canceled his 1 year follow up with Dr. Olga Millers because "I'm tired of coming up here and waiting!"  He denies chest pain, dyspnea, syncope, orthopnea, PND, significant edema.  He is limited by his knees.  Clearly cannot achieve 4 METs.  Notes myalgias to most statins.  Cholesterol followed at the [place].  Currently tolerating pravastatin.   Wt Readings from Last 3 Encounters:  04/11/12 225 lb (102.059 kg)  07/31/10 238 lb (107.956 kg)  08/02/09 238 lb (107.956 kg)     Past Medical History  Diagnosis Date  . Diabetes mellitus   . Hypertension   . Hypercholesteremia   . CAD (coronary artery disease)     a. s/p CABG 02/2003:  L-Dx, L radial-OM1, S-OM2/dCFX, S-RCA;  b.  Myoview 12/09: no ischemia, EF 59%  . Carotid stenosis     a. dopplers 1/12:  RICA 0-39%, LICA 40-59%  . Gout     Current Outpatient Prescriptions  Medication Sig Dispense Refill  . allopurinol (ZYLOPRIM) 300 MG tablet Take 300 mg by mouth daily.      Marland Kitchen amLODipine (NORVASC) 10 MG  tablet Take 10 mg by mouth daily.      Marland Kitchen aspirin 325 MG EC tablet Take 325 mg by mouth daily.      . Cholecalciferol (VITAMIN D3) 3000 UNITS TABS Take 1,000 Units by mouth.      . fish oil-omega-3 fatty acids 1000 MG capsule Take 2 g by mouth daily.      . metFORMIN (GLUCOPHAGE) 500 MG tablet Take 500 mg by mouth 2 (two) times daily with a meal.      . metoprolol succinate (TOPROL-XL) 50 MG 24 hr tablet Take 50 mg by mouth daily. Take with or immediately following a meal.      . Multiple Vitamin (MULTIVITAMIN) tablet Take 1 tablet by mouth daily.      . pravastatin (PRAVACHOL) 20 MG tablet Take 20 mg by mouth daily.      . tacrolimus (PROGRAF) 1 MG capsule Take 1 mg by mouth 2 (two) times daily.      Marland Kitchen terazosin (HYTRIN) 5 MG capsule Take 5 mg by mouth at bedtime.        Allergies: No Known Allergies   History  Substance Use Topics  . Smoking status: Former Smoker    Start date: 04/11/1982  . Smokeless tobacco: Not on file  . Alcohol Use: No     ROS:  Please see the history of present illness.     All other systems reviewed and negative.   PHYSICAL EXAM: VS:  BP 138/72  Pulse 70  Resp 18  Ht 6' (1.829 m)  Wt 224 lb (101.606 kg)  BMI 30.38 kg/m2  SpO2 92% Well nourished, well developed, in no acute distress HEENT: normal Neck: no JVD Vascular:  Right carotid bruit Cardiac:  normal S1, S2; RRR; no murmur Lungs:  clear to auscultation bilaterally, no wheezing, rhonchi or rales Abd: soft, nontender, no hepatomegaly Ext: no edema Skin: warm and dry Neuro:  CNs 2-12 intact, no focal abnormalities noted Psych:  Angry at times  EKG:  NSR, HR 70, LAD, Ant Q waves, no significant change from prior tracing     ASSESSMENT AND PLAN:  1. Coronary Artery Disease:  Patient needs surgical clearance.  He is 9 years out from his CABG.  Last myoview was in 2009 and low risk.  However, he cannot achieve 4 METs.  So, unable to completely assess functional status.  At last visit with Dr.  Olga Millers, follow up myoview was planned when seen for next annual visit.  I have recommended proceeding with Lexiscan Myoview prior to proceeding with planned surgery.  At this, the patient became very upset.  I attempted to explain to him the reason for doing this.  He did not allow me to finish my explanation and left the room.  In the hallway, he proceeded to raise his voice with me and told me that he would just forget having his surgery.  He stated he was tired of waiting in doctors' offices and tired of having tests.  He was obviously not happy with his experience today.  So, I offered him the chance to speak with administration to share his frustrations.  He declined and left abruptly.  2. Hypertension:  Controlled.  Continue current therapy.  3. Hyperlipidemia:  Followed by PCP at [facility].  4. Carotid Stenosis:  Due for dopplers next year.  Signed, Tereso Newcomer, PA-C  11:36 AM 04/25/2012

## 2012-04-26 ENCOUNTER — Telehealth (INDEPENDENT_AMBULATORY_CARE_PROVIDER_SITE_OTHER): Payer: Self-pay | Admitting: General Surgery

## 2012-04-26 NOTE — Telephone Encounter (Signed)
Spoke with pt and informed him that we had received information from Dr. Jens Som about his cardiac clearance and that he is not approved for surgery by his standpoint.  The patient was very upset but informed me that he has already had this conversation with Dr. Jens Som and that he would proceed to just "deal with the pain".  I informed him that we ever do get approval from Dr. Jens Som to do his surgery then we would be happy to help with this.

## 2012-04-28 DIAGNOSIS — E119 Type 2 diabetes mellitus without complications: Secondary | ICD-10-CM | POA: Diagnosis not present

## 2012-04-28 DIAGNOSIS — N400 Enlarged prostate without lower urinary tract symptoms: Secondary | ICD-10-CM | POA: Diagnosis not present

## 2012-04-28 DIAGNOSIS — E785 Hyperlipidemia, unspecified: Secondary | ICD-10-CM | POA: Diagnosis not present

## 2012-04-28 DIAGNOSIS — Z79899 Other long term (current) drug therapy: Secondary | ICD-10-CM | POA: Diagnosis not present

## 2012-04-28 DIAGNOSIS — I259 Chronic ischemic heart disease, unspecified: Secondary | ICD-10-CM | POA: Diagnosis not present

## 2012-05-27 DIAGNOSIS — E119 Type 2 diabetes mellitus without complications: Secondary | ICD-10-CM | POA: Diagnosis not present

## 2012-05-27 DIAGNOSIS — Z23 Encounter for immunization: Secondary | ICD-10-CM | POA: Diagnosis not present

## 2012-06-17 DIAGNOSIS — E119 Type 2 diabetes mellitus without complications: Secondary | ICD-10-CM | POA: Diagnosis not present

## 2013-01-30 DIAGNOSIS — L570 Actinic keratosis: Secondary | ICD-10-CM | POA: Diagnosis not present

## 2013-01-30 DIAGNOSIS — L738 Other specified follicular disorders: Secondary | ICD-10-CM | POA: Diagnosis not present

## 2013-01-30 DIAGNOSIS — L280 Lichen simplex chronicus: Secondary | ICD-10-CM | POA: Diagnosis not present

## 2013-01-30 DIAGNOSIS — L439 Lichen planus, unspecified: Secondary | ICD-10-CM | POA: Diagnosis not present

## 2013-01-30 DIAGNOSIS — L739 Follicular disorder, unspecified: Secondary | ICD-10-CM | POA: Insufficient documentation

## 2013-08-14 ENCOUNTER — Ambulatory Visit (INDEPENDENT_AMBULATORY_CARE_PROVIDER_SITE_OTHER): Payer: Medicare Other | Admitting: Cardiology

## 2013-08-14 ENCOUNTER — Encounter: Payer: Self-pay | Admitting: Cardiology

## 2013-08-14 ENCOUNTER — Encounter: Payer: Self-pay | Admitting: *Deleted

## 2013-08-14 ENCOUNTER — Telehealth: Payer: Self-pay | Admitting: Cardiology

## 2013-08-14 VITALS — BP 146/86 | HR 84 | Ht 72.0 in | Wt 227.0 lb

## 2013-08-14 DIAGNOSIS — E785 Hyperlipidemia, unspecified: Secondary | ICD-10-CM

## 2013-08-14 DIAGNOSIS — I679 Cerebrovascular disease, unspecified: Secondary | ICD-10-CM

## 2013-08-14 DIAGNOSIS — I10 Essential (primary) hypertension: Secondary | ICD-10-CM

## 2013-08-14 DIAGNOSIS — I2510 Atherosclerotic heart disease of native coronary artery without angina pectoris: Secondary | ICD-10-CM | POA: Diagnosis not present

## 2013-08-14 NOTE — Assessment & Plan Note (Signed)
Continue aspirin. Intolerant to statins. Carotid Dopplers now being followed at the [place].

## 2013-08-14 NOTE — Patient Instructions (Signed)
Your physician wants you to follow-up in: ONE YEAR WITH DR CRENSHAW You will receive a reminder letter in the mail two months in advance. If you don't receive a letter, please call our office to schedule the follow-up appointment.   Your physician has requested that you have a lexiscan myoview. For further information please visit [email or web address]. Please follow instruction sheet, as given.   

## 2013-08-14 NOTE — Assessment & Plan Note (Signed)
Intolerant to statins. 

## 2013-08-14 NOTE — Assessment & Plan Note (Signed)
Continue aspirin. Intolerant to statins. Patient had one brief episode of back pain. Question similar to previous infarct pain. He also is considering knee surgery and repair of umbilical hernia. Plan Myoview for risk stratification.

## 2013-08-14 NOTE — Progress Notes (Signed)
HPI: FU CAD; s/p OOH MI and subsequent CABG in 2004, DM2, HTN, HL. Abdominal ultrasound in December of 2008 showed no aneurysm. Myoview 12/09: No scar or ischemia, EF 59%. Carotid Dopplers 2014 at the [facility] showed mild to moderate plaque bilaterally. No significant obstruction. Since he was last seen, he has had difficulty with mobility because of bilateral knee pain. He has not had dyspnea. No chest pain but brief back pain recently questioned similar to his previous infarct pain. No palpitations or syncope.   Current Outpatient Prescriptions  Medication Sig Dispense Refill  . allopurinol (ZYLOPRIM) 300 MG tablet Take 300 mg by mouth daily.      Marland Kitchen amLODipine (NORVASC) 10 MG tablet Take 10 mg by mouth daily.      Marland Kitchen aspirin 325 MG EC tablet Take 325 mg by mouth daily.      . fish oil-omega-3 fatty acids 1000 MG capsule Take 2 g by mouth daily.      Marland Kitchen glipiZIDE (GLUCOTROL) 5 MG tablet Take 5 mg by mouth daily before breakfast.      . metFORMIN (GLUCOPHAGE) 1000 MG tablet Take 1,000 mg by mouth 2 (two) times daily with a meal.      . metoprolol tartrate (LOPRESSOR) 25 MG tablet Take 25 mg by mouth 2 (two) times daily.      . Multiple Vitamin (MULTIVITAMIN) tablet Take 1 tablet by mouth daily.      . tacrolimus (PROGRAF) 1 MG capsule Take 1 mg by mouth 2 (two) times daily.       No current facility-administered medications for this visit.     Past Medical History  Diagnosis Date  . Diabetes mellitus   . Hypertension   . Hypercholesteremia   . CAD (coronary artery disease)     a. s/p CABG 02/2003:  L-Dx, L radial-OM1, S-OM2/dCFX, S-RCA;  b.  Myoview 12/09: no ischemia, EF 59%  . Carotid stenosis     a. dopplers 3/41:  RICA 9-62%, LICA 22-97%  . Gout     Past Surgical History  Procedure Laterality Date  . Bypass graft  2004    x5  . Back surgery    . Knee surgery    . Toe surgery      gout  . Rotator cuff repair      left    History   Social History  . Marital Status:  Divorced    Spouse Name: N/A    Number of Children: N/A  . Years of Education: N/A   Occupational History  . Not on file.   Social History Main Topics  . Smoking status: Former Smoker    Start date: 04/11/1982  . Smokeless tobacco: Not on file  . Alcohol Use: No  . Drug Use: No  . Sexual Activity: Not on file   Other Topics Concern  . Not on file   Social History Narrative  . No narrative on file    ROS: bilateral knee pain right greater than left, bilateral hand edema and pain but no fevers or chills, productive cough, hemoptysis, dysphasia, odynophagia, melena, hematochezia, dysuria, hematuria, rash, seizure activity, orthopnea, PND, pedal edema, claudication. Remaining systems are negative.  Physical Exam: Well-developed well-nourished in no acute distress.  Skin is warm and dry.  HEENT is normal.  Neck is supple.  Chest is clear to auscultation with normal expansion.  Cardiovascular exam is regular rate and rhythm.  Abdominal exam nontender or distended. No masses palpated. Extremities  show no edema. neuro grossly intact  ECG sinus rhythm at a rate of 83. Left axis deviation. Nonspecific ST changes.

## 2013-08-14 NOTE — Telephone Encounter (Signed)
New Message  Pt called states that he does not have any energy// for the last 3 months his hands have been swollen.. Not able to use them. Its getting unbearable/ He is not sure if its due to diabetes or if its heart related. // Ok to schedule same day per Fredia Beets.

## 2013-08-14 NOTE — Assessment & Plan Note (Signed)
Blood pressure is mildly elevated. He discontinued lisinopril recently because of cough. He will follow his blood pressure and if elevated an ARB would be a good choice. He will discuss this with his physicians at the [place] as he can obtain his medications free.

## 2013-08-18 ENCOUNTER — Encounter: Payer: Self-pay | Admitting: Cardiology

## 2013-08-22 ENCOUNTER — Ambulatory Visit (HOSPITAL_COMMUNITY): Payer: Medicare Other | Attending: Cardiology | Admitting: Radiology

## 2013-08-22 VITALS — BP 143/91 | HR 82 | Ht 72.0 in | Wt 221.0 lb

## 2013-08-22 DIAGNOSIS — I10 Essential (primary) hypertension: Secondary | ICD-10-CM | POA: Diagnosis not present

## 2013-08-22 DIAGNOSIS — R0989 Other specified symptoms and signs involving the circulatory and respiratory systems: Secondary | ICD-10-CM | POA: Insufficient documentation

## 2013-08-22 DIAGNOSIS — I6529 Occlusion and stenosis of unspecified carotid artery: Secondary | ICD-10-CM | POA: Insufficient documentation

## 2013-08-22 DIAGNOSIS — E119 Type 2 diabetes mellitus without complications: Secondary | ICD-10-CM | POA: Diagnosis not present

## 2013-08-22 DIAGNOSIS — R0609 Other forms of dyspnea: Secondary | ICD-10-CM | POA: Diagnosis not present

## 2013-08-22 DIAGNOSIS — I252 Old myocardial infarction: Secondary | ICD-10-CM | POA: Insufficient documentation

## 2013-08-22 DIAGNOSIS — Z951 Presence of aortocoronary bypass graft: Secondary | ICD-10-CM | POA: Insufficient documentation

## 2013-08-22 DIAGNOSIS — Z01810 Encounter for preprocedural cardiovascular examination: Secondary | ICD-10-CM | POA: Diagnosis not present

## 2013-08-22 DIAGNOSIS — Z87891 Personal history of nicotine dependence: Secondary | ICD-10-CM | POA: Diagnosis not present

## 2013-08-22 DIAGNOSIS — I2510 Atherosclerotic heart disease of native coronary artery without angina pectoris: Secondary | ICD-10-CM | POA: Diagnosis not present

## 2013-08-22 MED ORDER — REGADENOSON 0.4 MG/5ML IV SOLN
0.4000 mg | Freq: Once | INTRAVENOUS | Status: AC
  Administered 2013-08-22: 0.4 mg via INTRAVENOUS

## 2013-08-22 MED ORDER — TECHNETIUM TC 99M SESTAMIBI GENERIC - CARDIOLITE
33.0000 | Freq: Once | INTRAVENOUS | Status: AC | PRN
  Administered 2013-08-22: 33 via INTRAVENOUS

## 2013-08-22 MED ORDER — TECHNETIUM TC 99M SESTAMIBI GENERIC - CARDIOLITE
10.8000 | Freq: Once | INTRAVENOUS | Status: AC | PRN
  Administered 2013-08-22: 11 via INTRAVENOUS

## 2013-08-22 NOTE — Progress Notes (Signed)
[facility] [address] [place], [place] [address] [phone]    Cardiology Nuclear Med Study  Francisco Howe is a 68 y.o. male     MRN : [id number]     DOB: 04-08-1946  Procedure Date: 08/22/2013  Nuclear Med Background Indication for Stress Test:  Evaluation for Ischemia, Surgical Clearance:Possible knee surgery or Umbilical Repair and Graft Patency History:  CAD, MI, CABG, MPI 2009 (normal) EF 59%  Cardiac Risk Factors: Carotid Disease, History of Smoking, Hypertension, Lipids and NIDDM  Symptoms:  None indicated   Nuclear Pre-Procedure Caffeine/Decaff Intake:  None NPO After: 7:30am   Lungs:  clear O2 Sat: 95% on room air. IV 0.9% NS with Angio Cath:  20g  IV Site: R Wrist  IV Started by:  Matilde Haymaker, RN  Chest Size (in):  46 Cup Size: n/a  Height: 6' (1.829 m)  Weight:  221 lb (100.245 kg)  BMI:  Body mass index is 29.97 kg/(m^2). Tech Comments:  Lopressor taken at 0730 today    Nuclear Med Study 1 or 2 day study: 1 day  Stress Test Type:  Treadmill/Lexiscan  Reading MD: n/a  Order Authorizing Provider:  Queen Blossom  Resting Radionuclide: Technetium 27m Sestamibi  Resting Radionuclide Dose: 11.0 mCi   Stress Radionuclide:  Technetium 61m Sestamibi  Stress Radionuclide Dose: 33.0 mCi           Stress Protocol Rest HR: 82 Stress HR: 116  Rest BP: 143/91 Stress BP: 165/83  Exercise Time (min): n/a METS: n/a           Dose of Adenosine (mg):  n/a Dose of Lexiscan: 0.4 mg  Dose of Atropine (mg): n/a Dose of Dobutamine: n/a mcg/kg/min (at max HR)  Stress Test Technologist: Glade Lloyd, BS-ES  Nuclear Technologist:  Charlton Amor, CNMT     Rest Procedure:  Myocardial perfusion imaging was performed at rest 45 minutes following the intravenous administration of Technetium 60m Sestamibi. Rest ECG: NSR, LAD  Stress Procedure:  The patient received IV Lexiscan 0.4 mg over 15-seconds with concurrent low level exercise  and then Technetium 71m Sestamibi was injected at 30-seconds while the patient continued walking one more minute.  Quantitative spect images were obtained after a 45-minute delay.  During the infusion of Lexiscan, the patient complained of SOB.  This resolved in recovery.  Stress ECG: No significant change from baseline ECG  QPS Raw Data Images:  Normal; no motion artifact; normal heart/lung ratio. Stress Images:  There is a small area of mildly decraese uptake in the basala and mid inferolateral walls.  Rest Images:  Normal homogeneous uptake in all areas of the myocardium. Subtraction (SDS):  There is a small size mildly reversible perfusion defect in the basal and mid inefrolateral segments. (SDS 2) Transient Ischemic Dilatation (Normal <1.22):  0.97 Lung/Heart Ratio (Normal <0.45):  0.30  Quantitative Gated Spect Images QGS EDV:  126 ml QGS ESV:  57 ml  Impression Exercise Capacity:  Lexiscan with low level exercise. BP Response:  Normal blood pressure response. Clinical Symptoms:  There is dyspnea. ECG Impression:  No significant ST segment change suggestive of ischemia. Comparison with Prior Nuclear Study: No images to compare  Overall Impression:  Low risk stress nuclear study with small, minimally mildly reversible defect in the basal and mid inferolateral segments (SDS2) .  LV Ejection Fraction: 55%.  LV Wall Motion:  NL LV Function; NL Wall Motion   Ena Dawley, Lemmie Evens 08/22/2013

## 2013-08-23 DIAGNOSIS — M19049 Primary osteoarthritis, unspecified hand: Secondary | ICD-10-CM | POA: Diagnosis not present

## 2013-08-23 DIAGNOSIS — M069 Rheumatoid arthritis, unspecified: Secondary | ICD-10-CM | POA: Diagnosis not present

## 2013-08-28 DIAGNOSIS — M069 Rheumatoid arthritis, unspecified: Secondary | ICD-10-CM | POA: Diagnosis not present

## 2013-08-28 DIAGNOSIS — M19049 Primary osteoarthritis, unspecified hand: Secondary | ICD-10-CM | POA: Diagnosis not present

## 2013-10-12 DIAGNOSIS — R5381 Other malaise: Secondary | ICD-10-CM | POA: Diagnosis not present

## 2013-10-12 DIAGNOSIS — R5383 Other fatigue: Secondary | ICD-10-CM | POA: Diagnosis not present

## 2013-10-12 DIAGNOSIS — M2550 Pain in unspecified joint: Secondary | ICD-10-CM | POA: Diagnosis not present

## 2013-10-12 DIAGNOSIS — M25549 Pain in joints of unspecified hand: Secondary | ICD-10-CM | POA: Diagnosis not present

## 2013-10-12 DIAGNOSIS — M25579 Pain in unspecified ankle and joints of unspecified foot: Secondary | ICD-10-CM | POA: Diagnosis not present

## 2013-10-12 DIAGNOSIS — M25569 Pain in unspecified knee: Secondary | ICD-10-CM | POA: Diagnosis not present

## 2013-10-18 DIAGNOSIS — M069 Rheumatoid arthritis, unspecified: Secondary | ICD-10-CM | POA: Diagnosis not present

## 2013-10-18 DIAGNOSIS — Z09 Encounter for follow-up examination after completed treatment for conditions other than malignant neoplasm: Secondary | ICD-10-CM | POA: Diagnosis not present

## 2013-10-18 DIAGNOSIS — M25549 Pain in joints of unspecified hand: Secondary | ICD-10-CM | POA: Diagnosis not present

## 2013-10-18 DIAGNOSIS — M1A00X0 Idiopathic chronic gout, unspecified site, without tophus (tophi): Secondary | ICD-10-CM | POA: Diagnosis not present

## 2013-10-18 DIAGNOSIS — M2550 Pain in unspecified joint: Secondary | ICD-10-CM | POA: Diagnosis not present

## 2013-11-23 DIAGNOSIS — M1A00X0 Idiopathic chronic gout, unspecified site, without tophus (tophi): Secondary | ICD-10-CM | POA: Diagnosis not present

## 2013-11-23 DIAGNOSIS — Z09 Encounter for follow-up examination after completed treatment for conditions other than malignant neoplasm: Secondary | ICD-10-CM | POA: Diagnosis not present

## 2013-11-23 DIAGNOSIS — M2550 Pain in unspecified joint: Secondary | ICD-10-CM | POA: Diagnosis not present

## 2013-11-23 DIAGNOSIS — M069 Rheumatoid arthritis, unspecified: Secondary | ICD-10-CM | POA: Diagnosis not present

## 2013-12-22 DIAGNOSIS — M1710 Unilateral primary osteoarthritis, unspecified knee: Secondary | ICD-10-CM | POA: Diagnosis not present

## 2013-12-22 DIAGNOSIS — M25569 Pain in unspecified knee: Secondary | ICD-10-CM | POA: Diagnosis not present

## 2013-12-29 ENCOUNTER — Telehealth: Payer: Self-pay | Admitting: Cardiology

## 2013-12-29 NOTE — Telephone Encounter (Signed)
Received request from Nurse fax box, documents faxed for surgical clearance. To: Antionette Char  Fax number: [phone]  Attention: [phone]/km

## 2014-02-09 DIAGNOSIS — M1710 Unilateral primary osteoarthritis, unspecified knee: Secondary | ICD-10-CM | POA: Diagnosis not present

## 2014-02-14 ENCOUNTER — Encounter (HOSPITAL_COMMUNITY): Payer: Self-pay | Admitting: Pharmacy Technician

## 2014-02-14 NOTE — H&P (Signed)
TOTAL KNEE ADMISSION H&P  Patient is being admitted for right total knee arthroplasty.  Subjective:  Chief Complaint:   Right knee OA / pain.  HPI: Francisco Howe, 68 y.o. male, has a history of pain and functional disability in the right knee due to arthritis and has failed non-surgical conservative treatments for greater than 12 weeks to include NSAID's and/or analgesics, corticosteriod injections and activity modification.  Onset of symptoms was gradual, starting >10 years ago with gradually worsening course since that time. The patient noted prior procedures on the knee to include  arthroscopy on the right knee(s).  Patient currently rates pain in the right knee(s) at 7 out of 10 with activity. Patient has worsening of pain with activity and weight bearing, pain that interferes with activities of daily living, pain with passive range of motion, crepitus and joint swelling.  Patient has evidence of periarticular osteophytes and joint space narrowing by imaging studies.  There is no active infection.  Risks, benefits and expectations were discussed with the patient.  Risks including but not limited to the risk of anesthesia, blood clots, nerve damage, blood vessel damage, failure of the prosthesis, infection and up to and including death.  Patient understand the risks, benefits and expectations and wishes to proceed with surgery.   PCP: Odette Fraction, MD  D/C Plans:      Home with OOPT - prefers not to have HHPT  Post-op Meds:       No Rx given   Tranexamic Acid:      To be given - topically  Decadron:      Is not to be given  FYI:     Xarelto then ASA  Norco post-op   Patient Active Problem List   Diagnosis Date Noted  . PURE HYPERCHOLESTEROLEMIA 08/01/2010  . HYPERLIPIDEMIA 08/01/2009  . GOUT 08/01/2009  . HYPERTENSION 08/01/2009  . CAD 08/01/2009  . CEREBROVASCULAR DISEASE 08/01/2009   Past Medical History  Diagnosis Date  . Diabetes mellitus   . Hypertension   .  Hypercholesteremia   . CAD (coronary artery disease)     a. s/p CABG 02/2003:  L-Dx, L radial-OM1, S-OM2/dCFX, S-RCA;  b.  Myoview 12/09: no ischemia, EF 59%  . Carotid stenosis     a. dopplers 5/32:  RICA 9-92%, LICA 42-68%  . Gout     Past Surgical History  Procedure Laterality Date  . Bypass graft  2004    x5  . Back surgery    . Knee surgery    . Toe surgery      gout  . Rotator cuff repair      left    No prescriptions prior to admission   No Known Allergies   History  Substance Use Topics  . Smoking status: Former Smoker    Start date: 04/11/1982  . Smokeless tobacco: Not on file  . Alcohol Use: No    Family History  Problem Relation Age of Onset  . Heart disease Mother   . Addison's disease Sister      Review of Systems  Constitutional: Negative.   HENT: Negative.   Eyes: Negative.   Respiratory: Negative.   Cardiovascular: Negative.   Gastrointestinal: Negative.   Genitourinary: Positive for frequency.  Musculoskeletal: Positive for back pain and joint pain.  Skin: Negative.   Neurological: Negative.   Endo/Heme/Allergies: Negative.   Psychiatric/Behavioral: Negative.     Objective:  Physical Exam  Constitutional: He is oriented to person, place, and time. He  appears well-developed and well-nourished.  HENT:  Head: Normocephalic and atraumatic.  Mouth/Throat: Oropharynx is clear and moist.  Eyes: Pupils are equal, round, and reactive to light.  Neck: Neck supple. No JVD present. No tracheal deviation present. No thyromegaly present.  Cardiovascular: Normal rate, regular rhythm, normal heart sounds and intact distal pulses.   Respiratory: Effort normal and breath sounds normal. No respiratory distress. He has no wheezes.  GI: Soft. There is no tenderness. There is no guarding.  Musculoskeletal:       Right knee: He exhibits decreased range of motion, swelling, abnormal alignment (Varus) and bony tenderness. He exhibits no ecchymosis, no deformity  and no laceration. Tenderness found.  Lymphadenopathy:    He has no cervical adenopathy.  Neurological: He is alert and oriented to person, place, and time.  Skin: Skin is warm and dry.  Psychiatric: He has a normal mood and affect.     Labs:  Estimated body mass index is 29.97 kg/(m^2) as calculated from the following:   Height as of 08/22/13: 6' (1.829 m).   Weight as of 08/22/13: 100.245 kg (221 lb).   Imaging Review Plain radiographs demonstrate severe degenerative joint disease of the right knee(s). The overall alignment is significant varus. The bone quality appears to be good for age and reported activity level.  Assessment/Plan:  End stage arthritis, right knee   The patient history, physical examination, clinical judgment of the provider and imaging studies are consistent with end stage degenerative joint disease of the right knee(s) and total knee arthroplasty is deemed medically necessary. The treatment options including medical management, injection therapy arthroscopy and arthroplasty were discussed at length. The risks and benefits of total knee arthroplasty were presented and reviewed. The risks due to aseptic loosening, infection, stiffness, patella tracking problems, thromboembolic complications and other imponderables were discussed. The patient acknowledged the explanation, agreed to proceed with the plan and consent was signed. Patient is being admitted for inpatient treatment for surgery, pain control, PT, OT, prophylactic antibiotics, VTE prophylaxis, progressive ambulation and ADL's and discharge planning. The patient is planning to be discharged home with home health services.    [name]   PA-C  02/14/2014, 11:00 AM

## 2014-02-20 ENCOUNTER — Encounter (HOSPITAL_COMMUNITY)
Admission: RE | Admit: 2014-02-20 | Discharge: 2014-02-20 | Disposition: A | Discharge status: Home / Self Care | Payer: Medicare Other | Source: Ambulatory Visit | Attending: Orthopedic Surgery | Admitting: Orthopedic Surgery

## 2014-02-20 ENCOUNTER — Encounter (HOSPITAL_COMMUNITY): Payer: Self-pay

## 2014-02-20 ENCOUNTER — Encounter (INDEPENDENT_AMBULATORY_CARE_PROVIDER_SITE_OTHER): Payer: Self-pay

## 2014-02-20 DIAGNOSIS — Z01812 Encounter for preprocedural laboratory examination: Secondary | ICD-10-CM | POA: Diagnosis not present

## 2014-02-20 DIAGNOSIS — Z01818 Encounter for other preprocedural examination: Secondary | ICD-10-CM | POA: Insufficient documentation

## 2014-02-20 HISTORY — DX: Adverse effect of unspecified anesthetic, initial encounter: T41.45XA

## 2014-02-20 HISTORY — DX: Other complications of anesthesia, initial encounter: T88.59XA

## 2014-02-20 HISTORY — DX: Personal history of diseases of the skin and subcutaneous tissue: Z87.2

## 2014-02-20 HISTORY — DX: Malignant (primary) neoplasm, unspecified: C80.1

## 2014-02-20 HISTORY — DX: Umbilical hernia without obstruction or gangrene: K42.9

## 2014-02-20 HISTORY — DX: Unspecified osteoarthritis, unspecified site: M19.90

## 2014-02-20 LAB — CBC
HEMATOCRIT: 42 % (ref 39.0–52.0)
Hemoglobin: 14.8 g/dL (ref 13.0–17.0)
MCH: 31.2 pg (ref 26.0–34.0)
MCHC: 35.2 g/dL (ref 30.0–36.0)
MCV: 88.6 fL (ref 78.0–100.0)
Platelets: 218 10*3/uL (ref 150–400)
RBC: 4.74 MIL/uL (ref 4.22–5.81)
RDW: 14.8 % (ref 11.5–15.5)
WBC: 6.9 10*3/uL (ref 4.0–10.5)

## 2014-02-20 LAB — URINALYSIS, ROUTINE W REFLEX MICROSCOPIC
Bilirubin Urine: NEGATIVE
Glucose, UA: NEGATIVE mg/dL
Hgb urine dipstick: NEGATIVE
Ketones, ur: NEGATIVE mg/dL
LEUKOCYTES UA: NEGATIVE
Nitrite: NEGATIVE
PROTEIN: NEGATIVE mg/dL
SPECIFIC GRAVITY, URINE: 1.013 (ref 1.005–1.030)
UROBILINOGEN UA: 1 mg/dL (ref 0.0–1.0)
pH: 6.5 (ref 5.0–8.0)

## 2014-02-20 LAB — BASIC METABOLIC PANEL
Anion gap: 17 — ABNORMAL HIGH (ref 5–15)
BUN: 9 mg/dL (ref 6–23)
CO2: 22 meq/L (ref 19–32)
Calcium: 10 mg/dL (ref 8.4–10.5)
Chloride: 95 mEq/L — ABNORMAL LOW (ref 96–112)
Creatinine, Ser: 0.61 mg/dL (ref 0.50–1.35)
GFR calc Af Amer: >90 mL/min (ref >90)
GFR calc non Af Amer: >90 mL/min (ref >90)
Glucose, Bld: 139 mg/dL — ABNORMAL HIGH (ref 70–99)
Potassium: 4 mEq/L (ref 3.7–5.3)
SODIUM: 134 meq/L — AB (ref 137–147)

## 2014-02-20 LAB — SURGICAL PCR SCREEN
MRSA, PCR: NEGATIVE
Staphylococcus aureus: POSITIVE — AB

## 2014-02-20 LAB — ABO/RH: ABO/RH(D): A POS

## 2014-02-20 LAB — PROTIME-INR
INR: 0.89 (ref 0.00–1.49)
Prothrombin Time: 12.1 seconds (ref 11.6–15.2)

## 2014-02-20 LAB — APTT: aPTT: 26 seconds (ref 24–37)

## 2014-02-20 NOTE — Patient Instructions (Signed)
BRING LIVING WILL AND HEALTH CARE POWER OF ATTORNEY  YOUR SURGERY IS SCHEDULED AT [facility]  ON:  Tuesday  8/4  REPORT TO  SHORT STAY CENTER AT:  7:05 AM   PLEASE COME IN THE [name] ENTRANCE AND FOLLOW SIGNS TO SHORT STAY CENTER.  DO NOT EAT OR DRINK ANYTHING AFTER MIDNIGHT THE NIGHT BEFORE YOUR SURGERY.  YOU Francisco Howe BRUSH YOUR TEETH, RINSE OUT YOUR MOUTH--BUT NO WATER, NO FOOD, NO CHEWING GUM, NO MINTS, NO CANDIES, NO CHEWING TOBACCO.  PLEASE TAKE THE FOLLOWING MEDICATIONS THE AM OF YOUR SURGERY WITH A FEW SIPS OF WATER:  ALLOPURINOL, AMLODIPINE, METOPROLOL, PREDNISONE.   IF YOU ARE DIABETIC:  DO NOT TAKE ANY DIABETIC MEDICATIONS THE AM OF YOUR SURGERY.  IF YOU TAKE INSULIN IN THE EVENINGS--PLEASE ONLY TAKE 1/2 NORMAL EVENING DOSE THE NIGHT BEFORE YOUR SURGERY.  NO INSULIN THE AM OF YOUR SURGERY.    DO NOT BRING VALUABLES, MONEY, CREDIT CARDS.  DO NOT WEAR JEWELRY, MAKE-UP, NAIL POLISH AND NO METAL PINS OR CLIPS IN YOUR HAIR. CONTACT LENS, DENTURES / PARTIALS, GLASSES SHOULD NOT BE WORN TO SURGERY AND IN MOST CASES-HEARING AIDS WILL NEED TO BE REMOVED.  BRING YOUR GLASSES CASE, ANY EQUIPMENT NEEDED FOR YOUR CONTACT LENS. FOR PATIENTS ADMITTED TO THE HOSPITAL--CHECK OUT TIME THE DAY OF DISCHARGE IS 11:00 AM.  ALL INPATIENT ROOMS ARE PRIVATE - WITH BATHROOM, TELEPHONE, TELEVISION AND WIFI INTERNET.                                                    PLEASE READ OVER ANY  FACT SHEETS THAT YOU WERE GIVEN: MRSA INFORMATION, BLOOD TRANSFUSION INFORMATION, INCENTIVE SPIROMETER INFORMATION.  PLEASE BE AWARE THAT YOU [name] NEED ADDITIONAL BLOOD DRAWN DAY OF YOUR SURGERY  _______________________________________________________________________   [facility] - Preparing for Surgery Before surgery, you can play an important role.  Because skin is not sterile, your skin needs to be as free of germs as possible.  You can reduce the number of germs on your skin by washing with  CHG (chlorahexidine gluconate) soap before surgery.  CHG is an antiseptic cleaner which kills germs and bonds with the skin to continue killing germs even after washing. Please DO NOT use if you have an allergy to CHG or antibacterial soaps.  If your skin becomes reddened/irritated stop using the CHG and inform your nurse when you arrive at Short Stay. Do not shave (including legs and underarms) for at least 48 hours prior to the first CHG shower.  You [name] shave your face/neck. Please follow these instructions carefully:  1.  Shower with CHG Soap the night before surgery and the  morning of Surgery.  2.  If you choose to wash your hair, wash your hair first as usual with your  normal  shampoo.  3.  After you shampoo, rinse your hair and body thoroughly to remove the  shampoo.                           4.  Use CHG as you would any other liquid soap.  You can apply chg directly  to the skin and wash                       Gently  with a scrungie or clean washcloth.  5.  Apply the CHG Soap to your body ONLY FROM THE NECK DOWN.   Do not use on face/ open                           Wound or open sores. Avoid contact with eyes, ears mouth and genitals (private parts).                       Wash face,  Genitals (private parts) with your normal soap.             6.  Wash thoroughly, paying special attention to the area where your surgery  will be performed.  7.  Thoroughly rinse your body with warm water from the neck down.  8.  DO NOT shower/wash with your normal soap after using and rinsing off  the CHG Soap.                9.  Pat yourself dry with a clean towel.            10.  Wear clean pajamas.            11.  Place clean sheets on your bed the night of your first shower and do not  sleep with pets. Day of Surgery : Do not apply any lotions/deodorants the morning of surgery.  Please wear clean clothes to the hospital/surgery center.  FAILURE TO FOLLOW THESE INSTRUCTIONS [place] RESULT IN THE CANCELLATION  OF YOUR SURGERY PATIENT SIGNATURE_________________________________  NURSE SIGNATURE__________________________________  ________________________________________________________________________   [name]  An incentive spirometer is a tool that can help keep your lungs clear and active. This tool measures how well you are filling your lungs with each breath. Taking long deep breaths Pharr help reverse or decrease the chance of developing breathing (pulmonary) problems (especially infection) following:  A long period of time when you are unable to move or be active. BEFORE THE PROCEDURE   If the spirometer includes an indicator to show your best effort, your nurse or respiratory therapist will set it to a desired goal.  If possible, sit up straight or lean slightly forward. Try not to slouch.  Hold the incentive spirometer in an upright position. INSTRUCTIONS FOR USE  1. Sit on the edge of your bed if possible, or sit up as far as you can in bed or on a chair. 2. Hold the incentive spirometer in an upright position. 3. Breathe out normally. 4. Place the mouthpiece in your mouth and seal your lips tightly around it. 5. Breathe in slowly and as deeply as possible, raising the piston or the ball toward the top of the column. 6. Hold your breath for 3-5 seconds or for as long as possible. Allow the piston or ball to fall to the bottom of the column. 7. Remove the mouthpiece from your mouth and breathe out normally. 8. Rest for a few seconds and repeat Steps 1 through 7 at least 10 times every 1-2 hours when you are awake. Take your time and take a few normal breaths between deep breaths. 9. The spirometer Burry include an indicator to show your best effort. Use the indicator as a goal to work toward during each repetition. 10. After each set of 10 deep breaths, practice coughing to be sure your lungs are clear. If you have an incision (the cut made at the time of surgery), support  your  incision when coughing by placing a pillow or rolled up towels firmly against it. Once you are able to get out of bed, walk around indoors and cough well. You Scheid stop using the incentive spirometer when instructed by your caregiver.  RISKS AND COMPLICATIONS  Take your time so you do not get dizzy or light-headed.  If you are in pain, you Chenier need to take or ask for pain medication before doing incentive spirometry. It is harder to take a deep breath if you are having pain. AFTER USE  Rest and breathe slowly and easily.  It can be helpful to keep track of a log of your progress. Your caregiver can provide you with a simple table to help with this. If you are using the spirometer at home, follow these instructions: [place] IF:   You are having difficultly using the spirometer.  You have trouble using the spirometer as often as instructed.  Your pain medication is not giving enough relief while using the spirometer.  You develop fever of 100.5 F (38.1 C) or higher. SEEK IMMEDIATE MEDICAL CARE IF:   You cough up bloody sputum that had not been present before.  You develop fever of 102 F (38.9 C) or greater.  You develop worsening pain at or near the incision site. MAKE SURE YOU:   Understand these instructions.  Will watch your condition.  Will get help right away if you are not doing well or get worse. Document Released: 11/23/2006 Document Revised: 10/05/2011 Document Reviewed: 01/24/2007 ExitCare Patient Information 2014 ExitCare, [place].   ________________________________________________________________________  WHAT IS A BLOOD TRANSFUSION? Blood Transfusion Information  A transfusion is the replacement of blood or some of its parts. Blood is made up of multiple cells which provide different functions.  Red blood cells carry oxygen and are used for blood loss replacement.  White blood cells fight against infection.  Platelets control bleeding.  Plasma  helps clot blood.  Other blood products are available for specialized needs, such as hemophilia or other clotting disorders. BEFORE THE TRANSFUSION  Who gives blood for transfusions?   Healthy volunteers who are fully evaluated to make sure their blood is safe. This is blood bank blood. Transfusion therapy is the safest it has ever been in the practice of medicine. Before blood is taken from a donor, a complete history is taken to make sure that person has no history of diseases nor engages in risky social behavior (examples are intravenous drug use or sexual activity with multiple partners). The donor's travel history is screened to minimize risk of transmitting infections, such as malaria. The donated blood is tested for signs of infectious diseases, such as HIV and hepatitis. The blood is then tested to be sure it is compatible with you in order to minimize the chance of a transfusion reaction. If you or a relative donates blood, this is often done in anticipation of surgery and is not appropriate for emergency situations. It takes many days to process the donated blood. RISKS AND COMPLICATIONS Although transfusion therapy is very safe and saves many lives, the main dangers of transfusion include:   Getting an infectious disease.  Developing a transfusion reaction. This is an allergic reaction to something in the blood you were given. Every precaution is taken to prevent this. The decision to have a blood transfusion has been considered carefully by your caregiver before blood is given. Blood is not given unless the benefits outweigh the risks. AFTER THE TRANSFUSION  Right after receiving a blood transfusion, you will usually feel much better and more energetic. This is especially true if your red blood cells have gotten low (anemic). The transfusion raises the level of the red blood cells which carry oxygen, and this usually causes an energy increase.  The nurse administering the transfusion  will monitor you carefully for complications. HOME CARE INSTRUCTIONS  No special instructions are needed after a transfusion. You Corcino find your energy is better. Speak with your caregiver about any limitations on activity for underlying diseases you [place] have. SEEK MEDICAL CARE IF:   Your condition is not improving after your transfusion.  You develop redness or irritation at the intravenous (IV) site. SEEK IMMEDIATE MEDICAL CARE IF:  Any of the following symptoms occur over the next 12 hours:  Shaking chills.  You have a temperature by mouth above 102 F (38.9 C), not controlled by medicine.  Chest, back, or muscle pain.  People around you feel you are not acting correctly or are confused.  Shortness of breath or difficulty breathing.  Dizziness and fainting.  You get a rash or develop hives.  You have a decrease in urine output.  Your urine turns a dark color or changes to pink, red, or brown. Any of the following symptoms occur over the next 10 days:  You have a temperature by mouth above 102 F (38.9 C), not controlled by medicine.  Shortness of breath.  Weakness after normal activity.  The white part of the eye turns yellow (jaundice).  You have a decrease in the amount of urine or are urinating less often.  Your urine turns a dark color or changes to pink, red, or brown. Document Released: 07/10/2000 Document Revised: 10/05/2011 Document Reviewed: 02/27/2008 [facility] Patient Information 2014 [place], [place].  _______________________________________________________________________

## 2014-02-20 NOTE — Progress Notes (Signed)
02/20/14 1130  OBSTRUCTIVE SLEEP APNEA  Have you ever been diagnosed with sleep apnea through a sleep study? No  Do you snore loudly (loud enough to be heard through closed doors)?  1  Do you often feel tired, fatigued, or sleepy during the daytime? 0  Has anyone observed you stop breathing during your sleep? 0  Do you have, or are you being treated for high blood pressure? 1  BMI more than 35 kg/m2? 0  Age over 68 years old? 1  Neck circumference greater than 40 cm/16 inches? 1  Gender: 1  Obstructive Sleep Apnea Score 5  Score 4 or greater  Results sent to PCP

## 2014-02-20 NOTE — Pre-Procedure Instructions (Signed)
PT'S CARDIAC CLEARANCE FROM DR. CRENSHAW ON HIS CHART.  CARDIOLOGY OFFICE NOTE AND EKG REPORT IN EPIC FROM 08-14-13. CXR REPORT 10/23/13 OBTAINED FROM MEDICAL RECORDS [place] AND ON CHART.

## 2014-02-27 ENCOUNTER — Encounter (HOSPITAL_COMMUNITY)
Admission: RE | Disposition: A | Discharge status: Home Health | Payer: Self-pay | Source: Ambulatory Visit | Attending: Orthopedic Surgery

## 2014-02-27 ENCOUNTER — Inpatient Hospital Stay (HOSPITAL_COMMUNITY): Payer: Medicare Other | Admitting: Anesthesiology

## 2014-02-27 ENCOUNTER — Encounter (HOSPITAL_COMMUNITY): Payer: Medicare Other | Admitting: Anesthesiology

## 2014-02-27 ENCOUNTER — Encounter (HOSPITAL_COMMUNITY): Payer: Self-pay | Admitting: *Deleted

## 2014-02-27 ENCOUNTER — Inpatient Hospital Stay (HOSPITAL_COMMUNITY)
Admission: RE | Admit: 2014-02-27 | Discharge: 2014-03-01 | DRG: 470 | Disposition: A | Discharge status: Home Health | Payer: Medicare Other | Source: Ambulatory Visit | Attending: Orthopedic Surgery | Admitting: Orthopedic Surgery

## 2014-02-27 DIAGNOSIS — Z96651 Presence of right artificial knee joint: Secondary | ICD-10-CM

## 2014-02-27 DIAGNOSIS — M109 Gout, unspecified: Secondary | ICD-10-CM | POA: Diagnosis present

## 2014-02-27 DIAGNOSIS — Z87891 Personal history of nicotine dependence: Secondary | ICD-10-CM

## 2014-02-27 DIAGNOSIS — E119 Type 2 diabetes mellitus without complications: Secondary | ICD-10-CM | POA: Diagnosis present

## 2014-02-27 DIAGNOSIS — Z6831 Body mass index (BMI) 31.0-31.9, adult: Secondary | ICD-10-CM | POA: Diagnosis not present

## 2014-02-27 DIAGNOSIS — E669 Obesity, unspecified: Secondary | ICD-10-CM | POA: Diagnosis present

## 2014-02-27 DIAGNOSIS — E871 Hypo-osmolality and hyponatremia: Secondary | ICD-10-CM | POA: Diagnosis not present

## 2014-02-27 DIAGNOSIS — D62 Acute posthemorrhagic anemia: Secondary | ICD-10-CM | POA: Diagnosis not present

## 2014-02-27 DIAGNOSIS — I10 Essential (primary) hypertension: Secondary | ICD-10-CM | POA: Diagnosis present

## 2014-02-27 DIAGNOSIS — I679 Cerebrovascular disease, unspecified: Secondary | ICD-10-CM | POA: Diagnosis present

## 2014-02-27 DIAGNOSIS — E785 Hyperlipidemia, unspecified: Secondary | ICD-10-CM | POA: Diagnosis present

## 2014-02-27 DIAGNOSIS — I2510 Atherosclerotic heart disease of native coronary artery without angina pectoris: Secondary | ICD-10-CM | POA: Diagnosis present

## 2014-02-27 DIAGNOSIS — M6580 Other synovitis and tenosynovitis, unspecified site: Secondary | ICD-10-CM | POA: Diagnosis present

## 2014-02-27 DIAGNOSIS — M898X9 Other specified disorders of bone, unspecified site: Secondary | ICD-10-CM | POA: Diagnosis present

## 2014-02-27 DIAGNOSIS — Z951 Presence of aortocoronary bypass graft: Secondary | ICD-10-CM | POA: Diagnosis not present

## 2014-02-27 DIAGNOSIS — Z8249 Family history of ischemic heart disease and other diseases of the circulatory system: Secondary | ICD-10-CM | POA: Diagnosis not present

## 2014-02-27 DIAGNOSIS — M1710 Unilateral primary osteoarthritis, unspecified knee: Secondary | ICD-10-CM | POA: Diagnosis not present

## 2014-02-27 DIAGNOSIS — E7800 Pure hypercholesterolemia, unspecified: Secondary | ICD-10-CM | POA: Diagnosis present

## 2014-02-27 DIAGNOSIS — M25569 Pain in unspecified knee: Secondary | ICD-10-CM | POA: Diagnosis not present

## 2014-02-27 DIAGNOSIS — M25469 Effusion, unspecified knee: Secondary | ICD-10-CM | POA: Diagnosis present

## 2014-02-27 DIAGNOSIS — Z01812 Encounter for preprocedural laboratory examination: Secondary | ICD-10-CM | POA: Diagnosis not present

## 2014-02-27 DIAGNOSIS — Z96659 Presence of unspecified artificial knee joint: Secondary | ICD-10-CM

## 2014-02-27 HISTORY — PX: TOTAL KNEE ARTHROPLASTY: SHX125

## 2014-02-27 LAB — TYPE AND SCREEN
ABO/RH(D): A POS
ANTIBODY SCREEN: NEGATIVE

## 2014-02-27 LAB — GLUCOSE, CAPILLARY
GLUCOSE-CAPILLARY: 151 mg/dL — AB (ref 70–99)
GLUCOSE-CAPILLARY: 160 mg/dL — AB (ref 70–99)
GLUCOSE-CAPILLARY: 156 mg/dL — AB (ref 70–99)
Glucose-Capillary: 141 mg/dL — ABNORMAL HIGH (ref 70–99)

## 2014-02-27 SURGERY — ARTHROPLASTY, KNEE, TOTAL
Anesthesia: Spinal | Site: Knee | Laterality: Right

## 2014-02-27 MED ORDER — CEFAZOLIN SODIUM-DEXTROSE 2-3 GM-% IV SOLR
2.0000 g | Freq: Four times a day (QID) | INTRAVENOUS | Status: AC
  Administered 2014-02-27 (×2): 2 g via INTRAVENOUS
  Filled 2014-02-27 (×2): qty 50

## 2014-02-27 MED ORDER — CHLORHEXIDINE GLUCONATE 4 % EX LIQD: 60.0000 mL | Freq: Once | CUTANEOUS | Status: DC

## 2014-02-27 MED ORDER — PROPOFOL 10 MG/ML IV BOLUS
INTRAVENOUS | Status: DC | PRN
  Administered 2014-02-27 (×2): 20 mg via INTRAVENOUS

## 2014-02-27 MED ORDER — LIDOCAINE HCL 1 % IJ SOLN
INTRAMUSCULAR | Status: AC
  Filled 2014-02-27: qty 20

## 2014-02-27 MED ORDER — PROPOFOL 10 MG/ML IV BOLUS
INTRAVENOUS | Status: AC
  Filled 2014-02-27: qty 20

## 2014-02-27 MED ORDER — BUPIVACAINE LIPOSOME 1.3 % IJ SUSP
20.0000 mL | Freq: Once | INTRAMUSCULAR | Status: AC
  Administered 2014-02-27: 20 mL
  Filled 2014-02-27: qty 20

## 2014-02-27 MED ORDER — RIVAROXABAN 10 MG PO TABS
10.0000 mg | ORAL_TABLET | ORAL | Status: DC
Start: 2014-02-28 — End: 2014-03-01
  Administered 2014-02-28 – 2014-03-01 (×2): 10 mg via ORAL
  Filled 2014-02-27 (×3): qty 1

## 2014-02-27 MED ORDER — CEFAZOLIN SODIUM-DEXTROSE 2-3 GM-% IV SOLR
2.0000 g | INTRAVENOUS | Status: AC
  Administered 2014-02-27: 2 g via INTRAVENOUS

## 2014-02-27 MED ORDER — NITROGLYCERIN 0.4 MG SL SUBL: 0.4000 mg | SUBLINGUAL_TABLET | SUBLINGUAL | Status: DC | PRN

## 2014-02-27 MED ORDER — HYDROCODONE-ACETAMINOPHEN 7.5-325 MG PO TABS
1.0000 | ORAL_TABLET | ORAL | Status: DC
  Administered 2014-02-27 – 2014-02-28 (×7): 2 via ORAL
  Administered 2014-03-01: 1 via ORAL
  Administered 2014-03-01: 2 via ORAL
  Filled 2014-02-27 (×10): qty 2

## 2014-02-27 MED ORDER — MENTHOL 3 MG MT LOZG
1.0000 | LOZENGE | OROMUCOSAL | Status: DC | PRN
  Filled 2014-02-27: qty 9

## 2014-02-27 MED ORDER — BISACODYL 10 MG RE SUPP: 10.0000 mg | Freq: Every day | RECTAL | Status: DC | PRN

## 2014-02-27 MED ORDER — HYDROMORPHONE HCL PF 1 MG/ML IJ SOLN
0.5000 mg | INTRAMUSCULAR | Status: DC | PRN
  Administered 2014-02-27: 0.5 mg via INTRAVENOUS

## 2014-02-27 MED ORDER — FOLIC ACID 1 MG PO TABS
1.0000 mg | ORAL_TABLET | Freq: Every day | ORAL | Status: DC
  Administered 2014-02-27 – 2014-03-01 (×3): 1 mg via ORAL
  Filled 2014-02-27 (×3): qty 1

## 2014-02-27 MED ORDER — ACETAMINOPHEN 10 MG/ML IV SOLN
1000.0000 mg | Freq: Once | INTRAVENOUS | Status: DC
  Filled 2014-02-27: qty 100

## 2014-02-27 MED ORDER — TRANEXAMIC ACID 100 MG/ML IV SOLN
2000.0000 mg | INTRAVENOUS | Status: DC | PRN
  Administered 2014-02-27: 2000 mg via TOPICAL

## 2014-02-27 MED ORDER — KETOROLAC TROMETHAMINE 30 MG/ML IJ SOLN
INTRAMUSCULAR | Status: DC | PRN
  Administered 2014-02-27: 30 mg

## 2014-02-27 MED ORDER — METOCLOPRAMIDE HCL 5 MG/ML IJ SOLN: 5.0000 mg | Freq: Three times a day (TID) | INTRAMUSCULAR | Status: DC | PRN

## 2014-02-27 MED ORDER — BUPIVACAINE-EPINEPHRINE (PF) 0.25% -1:200000 IJ SOLN
INTRAMUSCULAR | Status: DC | PRN
  Administered 2014-02-27: 30 mL

## 2014-02-27 MED ORDER — POLYETHYLENE GLYCOL 3350 17 G PO PACK
17.0000 g | PACK | Freq: Two times a day (BID) | ORAL | Status: DC
  Administered 2014-02-27 – 2014-03-01 (×4): 17 g via ORAL

## 2014-02-27 MED ORDER — BUPIVACAINE-EPINEPHRINE (PF) 0.25% -1:200000 IJ SOLN
INTRAMUSCULAR | Status: AC
  Filled 2014-02-27: qty 30

## 2014-02-27 MED ORDER — DEXAMETHASONE SODIUM PHOSPHATE 10 MG/ML IJ SOLN
10.0000 mg | Freq: Once | INTRAMUSCULAR | Status: AC
  Administered 2014-02-28: 10 mg via INTRAVENOUS
  Filled 2014-02-27: qty 1

## 2014-02-27 MED ORDER — 0.9 % SODIUM CHLORIDE (POUR BTL) OPTIME
TOPICAL | Status: DC | PRN
  Administered 2014-02-27: 1000 mL

## 2014-02-27 MED ORDER — FENTANYL CITRATE 0.05 MG/ML IJ SOLN
INTRAMUSCULAR | Status: DC | PRN
  Administered 2014-02-27 (×4): 50 ug via INTRAVENOUS

## 2014-02-27 MED ORDER — ONDANSETRON HCL 4 MG/2ML IJ SOLN
4.0000 mg | Freq: Four times a day (QID) | INTRAMUSCULAR | Status: DC | PRN
Start: 2014-02-27 — End: 2014-03-01
  Administered 2014-02-28: 4 mg via INTRAVENOUS
  Filled 2014-02-27: qty 2

## 2014-02-27 MED ORDER — KETOROLAC TROMETHAMINE 30 MG/ML IJ SOLN
INTRAMUSCULAR | Status: AC
  Filled 2014-02-27: qty 1

## 2014-02-27 MED ORDER — ALLOPURINOL 300 MG PO TABS
300.0000 mg | ORAL_TABLET | Freq: Every day | ORAL | Status: DC
  Administered 2014-02-28 – 2014-03-01 (×2): 300 mg via ORAL
  Filled 2014-02-27 (×2): qty 1

## 2014-02-27 MED ORDER — HYDROMORPHONE HCL PF 1 MG/ML IJ SOLN
0.2500 mg | INTRAMUSCULAR | Status: DC | PRN
  Administered 2014-02-27: 0.5 mg via INTRAVENOUS

## 2014-02-27 MED ORDER — SODIUM CHLORIDE 0.9 % IJ SOLN
INTRAMUSCULAR | Status: DC | PRN
  Administered 2014-02-27: 9 mL

## 2014-02-27 MED ORDER — HYDROMORPHONE HCL PF 1 MG/ML IJ SOLN
INTRAMUSCULAR | Status: AC
  Filled 2014-02-27: qty 1

## 2014-02-27 MED ORDER — GLIPIZIDE 10 MG PO TABS
10.0000 mg | ORAL_TABLET | Freq: Two times a day (BID) | ORAL | Status: DC
  Administered 2014-02-28 – 2014-03-01 (×3): 10 mg via ORAL
  Filled 2014-02-27 (×5): qty 1

## 2014-02-27 MED ORDER — ONDANSETRON HCL 4 MG PO TABS: 4.0000 mg | ORAL_TABLET | Freq: Four times a day (QID) | ORAL | Status: DC | PRN

## 2014-02-27 MED ORDER — METHOCARBAMOL 500 MG PO TABS
500.0000 mg | ORAL_TABLET | Freq: Four times a day (QID) | ORAL | Status: DC | PRN
  Administered 2014-02-27 – 2014-02-28 (×3): 500 mg via ORAL
  Filled 2014-02-27 (×5): qty 1

## 2014-02-27 MED ORDER — SODIUM CHLORIDE 0.9 % IR SOLN
Status: DC | PRN
  Administered 2014-02-27: 1000 mL

## 2014-02-27 MED ORDER — CLOBETASOL PROPIONATE 0.05 % EX OINT
1.0000 "application " | TOPICAL_OINTMENT | Freq: Two times a day (BID) | CUTANEOUS | Status: DC
  Filled 2014-02-27: qty 15

## 2014-02-27 MED ORDER — MIDAZOLAM HCL 5 MG/5ML IJ SOLN
INTRAMUSCULAR | Status: DC | PRN
  Administered 2014-02-27: 2 mg via INTRAVENOUS

## 2014-02-27 MED ORDER — FERROUS SULFATE 325 (65 FE) MG PO TABS
325.0000 mg | ORAL_TABLET | Freq: Three times a day (TID) | ORAL | Status: DC
  Administered 2014-02-28 – 2014-03-01 (×4): 325 mg via ORAL
  Filled 2014-02-27 (×7): qty 1

## 2014-02-27 MED ORDER — AMLODIPINE BESYLATE 10 MG PO TABS
10.0000 mg | ORAL_TABLET | Freq: Every morning | ORAL | Status: DC
  Administered 2014-03-01: 10 mg via ORAL
  Filled 2014-02-27 (×2): qty 1

## 2014-02-27 MED ORDER — CELECOXIB 200 MG PO CAPS
200.0000 mg | ORAL_CAPSULE | Freq: Two times a day (BID) | ORAL | Status: DC
  Administered 2014-02-27 – 2014-03-01 (×4): 200 mg via ORAL
  Filled 2014-02-27 (×5): qty 1

## 2014-02-27 MED ORDER — TRANEXAMIC ACID 100 MG/ML IV SOLN
2000.0000 mg | Freq: Once | INTRAVENOUS | Status: DC
  Filled 2014-02-27: qty 20

## 2014-02-27 MED ORDER — MAGNESIUM CITRATE PO SOLN: 1.0000 | Freq: Once | ORAL | Status: AC | PRN

## 2014-02-27 MED ORDER — FENTANYL CITRATE 0.05 MG/ML IJ SOLN
INTRAMUSCULAR | Status: AC
  Filled 2014-02-27: qty 2

## 2014-02-27 MED ORDER — LACTATED RINGERS IV SOLN
INTRAVENOUS | Status: DC
  Administered 2014-02-27: 11:00:00 via INTRAVENOUS
  Administered 2014-02-27: 1000 mL via INTRAVENOUS

## 2014-02-27 MED ORDER — PREDNISONE 5 MG PO TABS
5.0000 mg | ORAL_TABLET | Freq: Every day | ORAL | Status: DC
  Administered 2014-02-28 – 2014-03-01 (×2): 5 mg via ORAL
  Filled 2014-02-27 (×3): qty 1

## 2014-02-27 MED ORDER — ONDANSETRON HCL 4 MG/2ML IJ SOLN
INTRAMUSCULAR | Status: DC | PRN
  Administered 2014-02-27 (×2): 2 mg via INTRAVENOUS

## 2014-02-27 MED ORDER — METOPROLOL TARTRATE 12.5 MG HALF TABLET
12.5000 mg | ORAL_TABLET | Freq: Two times a day (BID) | ORAL | Status: DC
  Administered 2014-02-27 – 2014-03-01 (×4): 12.5 mg via ORAL
  Filled 2014-02-27 (×5): qty 1

## 2014-02-27 MED ORDER — DEXTROSE 5 % IV SOLN
500.0000 mg | Freq: Four times a day (QID) | INTRAVENOUS | Status: DC | PRN
  Administered 2014-02-27: 500 mg via INTRAVENOUS
  Filled 2014-02-27: qty 5

## 2014-02-27 MED ORDER — SODIUM CHLORIDE 0.9 % IJ SOLN
INTRAMUSCULAR | Status: AC
  Filled 2014-02-27: qty 10

## 2014-02-27 MED ORDER — METFORMIN HCL 500 MG PO TABS
1000.0000 mg | ORAL_TABLET | Freq: Two times a day (BID) | ORAL | Status: DC
  Administered 2014-02-28 – 2014-03-01 (×3): 1000 mg via ORAL
  Filled 2014-02-27 (×5): qty 2

## 2014-02-27 MED ORDER — SODIUM CHLORIDE 0.9 % IV SOLN
INTRAVENOUS | Status: DC
  Administered 2014-02-27 – 2014-02-28 (×2): via INTRAVENOUS
  Filled 2014-02-27 (×4): qty 1000

## 2014-02-27 MED ORDER — ALUM & MAG HYDROXIDE-SIMETH 200-200-20 MG/5ML PO SUSP: 30.0000 mL | ORAL | Status: DC | PRN

## 2014-02-27 MED ORDER — DOCUSATE SODIUM 100 MG PO CAPS
100.0000 mg | ORAL_CAPSULE | Freq: Two times a day (BID) | ORAL | Status: DC
  Administered 2014-02-27 – 2014-03-01 (×4): 100 mg via ORAL

## 2014-02-27 MED ORDER — MIDAZOLAM HCL 2 MG/2ML IJ SOLN
INTRAMUSCULAR | Status: AC
  Filled 2014-02-27: qty 2

## 2014-02-27 MED ORDER — INSULIN DETEMIR 100 UNIT/ML ~~LOC~~ SOLN
10.0000 [IU] | Freq: Every day | SUBCUTANEOUS | Status: DC
  Administered 2014-02-27 – 2014-02-28 (×2): 10 [IU] via SUBCUTANEOUS
  Filled 2014-02-27 (×2): qty 0.1

## 2014-02-27 MED ORDER — DIPHENHYDRAMINE HCL 25 MG PO CAPS
25.0000 mg | ORAL_CAPSULE | Freq: Four times a day (QID) | ORAL | Status: DC | PRN
  Filled 2014-02-27: qty 1

## 2014-02-27 MED ORDER — BUPIVACAINE IN DEXTROSE 0.75-8.25 % IT SOLN
INTRATHECAL | Status: DC | PRN
  Administered 2014-02-27: 2 mL via INTRATHECAL

## 2014-02-27 MED ORDER — LACTATED RINGERS IV SOLN: INTRAVENOUS | Status: DC

## 2014-02-27 MED ORDER — METOCLOPRAMIDE HCL 10 MG PO TABS
5.0000 mg | ORAL_TABLET | Freq: Three times a day (TID) | ORAL | Status: DC | PRN
Start: 2014-02-27 — End: 2014-03-01

## 2014-02-27 MED ORDER — PROPOFOL INFUSION 10 MG/ML OPTIME
INTRAVENOUS | Status: DC | PRN
  Administered 2014-02-27: 100 ug/kg/min via INTRAVENOUS

## 2014-02-27 MED ORDER — LOSARTAN POTASSIUM 25 MG PO TABS
12.5000 mg | ORAL_TABLET | Freq: Every morning | ORAL | Status: DC
  Administered 2014-03-01: 12.5 mg via ORAL
  Filled 2014-02-27 (×3): qty 0.5

## 2014-02-27 MED ORDER — CEFAZOLIN SODIUM-DEXTROSE 2-3 GM-% IV SOLR
INTRAVENOUS | Status: AC
  Filled 2014-02-27: qty 50

## 2014-02-27 MED ORDER — PHENOL 1.4 % MT LIQD
1.0000 | OROMUCOSAL | Status: DC | PRN
  Filled 2014-02-27: qty 177

## 2014-02-27 SURGICAL SUPPLY — 65 items
ADH SKN CLS APL DERMABOND .7 (GAUZE/BANDAGES/DRESSINGS) ×1
BAG SPEC THK2 15X12 ZIP CLS (MISCELLANEOUS)
BAG ZIPLOCK 12X15 (MISCELLANEOUS) IMPLANT
BANDAGE ELASTIC 6 VELCRO ST LF (GAUZE/BANDAGES/DRESSINGS) ×3 IMPLANT
BANDAGE ESMARK 6X9 LF (GAUZE/BANDAGES/DRESSINGS) ×1 IMPLANT
BLADE SAW SGTL 13.0X1.19X90.0M (BLADE) ×3 IMPLANT
BNDG CMPR 9X6 STRL LF SNTH (GAUZE/BANDAGES/DRESSINGS) ×1
BNDG ESMARK 6X9 LF (GAUZE/BANDAGES/DRESSINGS) ×3
BONE CEMENT GENTAMICIN (Cement) ×6 IMPLANT
BOWL SMART MIX CTS (DISPOSABLE) ×3 IMPLANT
CAPT RP KNEE ×2 IMPLANT
CEMENT BONE GENTAMICIN 40 (Cement) IMPLANT
CUFF TOURN SGL QUICK 34 (TOURNIQUET CUFF) ×3
CUFF TRNQT CYL 34X4X40X1 (TOURNIQUET CUFF) ×1 IMPLANT
DERMABOND ADVANCED (GAUZE/BANDAGES/DRESSINGS) ×2
DERMABOND ADVANCED .7 DNX12 (GAUZE/BANDAGES/DRESSINGS) ×1 IMPLANT
DRAPE EXTREMITY T 121X128X90 (DRAPE) ×3 IMPLANT
DRAPE POUCH INSTRU U-SHP 10X18 (DRAPES) ×3 IMPLANT
DRAPE U-SHAPE 47X51 STRL (DRAPES) ×3 IMPLANT
DRSG AQUACEL AG ADV 3.5X10 (GAUZE/BANDAGES/DRESSINGS) ×3 IMPLANT
DRSG TEGADERM 4X4.75 (GAUZE/BANDAGES/DRESSINGS) IMPLANT
DURAPREP 26ML APPLICATOR (WOUND CARE) ×6 IMPLANT
ELECT REM PT RETURN 9FT ADLT (ELECTROSURGICAL) ×3
ELECTRODE REM PT RTRN 9FT ADLT (ELECTROSURGICAL) ×1 IMPLANT
FACESHIELD WRAPAROUND (MASK) ×15 IMPLANT
FACESHIELD WRAPAROUND OR TEAM (MASK) ×5 IMPLANT
GAUZE SPONGE 2X2 8PLY STRL LF (GAUZE/BANDAGES/DRESSINGS) IMPLANT
GLOVE BIO SURGEON STRL SZ7.5 (GLOVE) ×2 IMPLANT
GLOVE BIOGEL PI IND STRL 7.5 (GLOVE) ×1 IMPLANT
GLOVE BIOGEL PI IND STRL 8 (GLOVE) ×1 IMPLANT
GLOVE BIOGEL PI INDICATOR 7.5 (GLOVE) ×6
GLOVE BIOGEL PI INDICATOR 8 (GLOVE) ×2
GLOVE ECLIPSE 8.0 STRL XLNG CF (GLOVE) ×5 IMPLANT
GLOVE ORTHO TXT STRL SZ7.5 (GLOVE) ×6 IMPLANT
GLOVE SS BIOGEL STRL SZ 7 (GLOVE) IMPLANT
GLOVE SUPERSENSE BIOGEL SZ 7 (GLOVE) ×2
GLOVE SURG SS PI 6.5 STRL IVOR (GLOVE) ×2 IMPLANT
GLOVE SURG SS PI 7.5 STRL IVOR (GLOVE) ×2 IMPLANT
GOWN BRE IMP PREV XXLGXLNG (GOWN DISPOSABLE) ×2 IMPLANT
GOWN SPEC L3 XXLG W/TWL (GOWN DISPOSABLE) ×3 IMPLANT
GOWN STRL REUS W/TWL LRG LVL3 (GOWN DISPOSABLE) ×3 IMPLANT
GOWN STRL REUS W/TWL XL LVL3 (GOWN DISPOSABLE) ×2 IMPLANT
HANDPIECE INTERPULSE COAX TIP (DISPOSABLE) ×3
KIT BASIN OR (CUSTOM PROCEDURE TRAY) ×3 IMPLANT
MANIFOLD NEPTUNE II (INSTRUMENTS) ×3 IMPLANT
NDL SAFETY ECLIPSE 18X1.5 (NEEDLE) ×1 IMPLANT
NEEDLE HYPO 18GX1.5 SHARP (NEEDLE) ×6
PACK TOTAL JOINT (CUSTOM PROCEDURE TRAY) ×3 IMPLANT
POSITIONER SURGICAL ARM (MISCELLANEOUS) ×3 IMPLANT
SET HNDPC FAN SPRY TIP SCT (DISPOSABLE) ×1 IMPLANT
SET PAD KNEE POSITIONER (MISCELLANEOUS) ×3 IMPLANT
SPONGE GAUZE 2X2 STER 10/PKG (GAUZE/BANDAGES/DRESSINGS)
SUCTION FRAZIER 12FR DISP (SUCTIONS) ×3 IMPLANT
SUT MNCRL AB 4-0 PS2 18 (SUTURE) ×3 IMPLANT
SUT VIC AB 1 CT1 36 (SUTURE) ×3 IMPLANT
SUT VIC AB 2-0 CT1 27 (SUTURE) ×9
SUT VIC AB 2-0 CT1 TAPERPNT 27 (SUTURE) ×3 IMPLANT
SUT VLOC 180 0 24IN GS25 (SUTURE) ×3 IMPLANT
SYRINGE 10CC LL (SYRINGE) ×2 IMPLANT
SYRINGE 60CC LL (MISCELLANEOUS) ×5 IMPLANT
TOWEL OR 17X26 10 PK STRL BLUE (TOWEL DISPOSABLE) ×3 IMPLANT
TOWEL OR NON WOVEN STRL DISP B (DISPOSABLE) ×2 IMPLANT
TRAY FOLEY CATH 16FRSI W/METER (SET/KITS/TRAYS/PACK) ×2 IMPLANT
WATER STERILE IRR 1500ML POUR (IV SOLUTION) ×3 IMPLANT
WRAP KNEE MAXI GEL POST OP (GAUZE/BANDAGES/DRESSINGS) ×3 IMPLANT

## 2014-02-27 NOTE — Plan of Care (Signed)
Problem: Consults Goal: Diagnosis- Total Joint Replacement Primary Total Knee     

## 2014-02-27 NOTE — Progress Notes (Signed)
[facility] is providing the following services: Patient declined rw and commode - has both at home already  If patient discharges after hours, please call [phone].   Linward Headland 02/27/2014, 2:24 PM

## 2014-02-27 NOTE — Interval H&P Note (Signed)
History and Physical Interval Note:  02/27/2014 8:48 AM  Francisco Howe  has presented today for surgery, with the diagnosis of RIGHT KNEE OA   The various methods of treatment have been discussed with the patient and family. After consideration of risks, benefits and other options for treatment, the patient has consented to  Procedure(s): RIGHT TOTAL KNEE ARTHROPLASTY (Right) as a surgical intervention .  The patient's history has been reviewed, patient examined, no change in status, stable for surgery.  I have reviewed the patient's chart and labs.  Questions were answered to the patient's satisfaction.     Mauri Pole

## 2014-02-27 NOTE — Anesthesia Preprocedure Evaluation (Signed)
Anesthesia Evaluation  Patient identified by MRN, date of birth, ID band Patient awake    Reviewed: Allergy & Precautions, H&P , NPO status , Patient's Chart, lab work & pertinent test results, reviewed documented beta blocker date and time   Airway Mallampati: II TM Distance: >3 FB Neck ROM: full    Dental no notable dental hx. (+) Caps, Dental Advisory Given 2 front upper are caps:   Pulmonary neg pulmonary ROS, former smoker,  breath sounds clear to auscultation  Pulmonary exam normal       Cardiovascular Exercise Tolerance: Good hypertension, Pt. on medications and Pt. on home beta blockers + CAD and + CABG Rhythm:regular Rate:Normal     Neuro/Psych Carotid stenosis. Back surgery negative neurological ROS  negative psych ROS   GI/Hepatic negative GI ROS, Neg liver ROS,   Endo/Other  diabetes, Well Controlled, Type 2, Oral Hypoglycemic Agents  Renal/GU negative Renal ROS  negative genitourinary   Musculoskeletal   Abdominal   Peds  Hematology negative hematology ROS (+)   Anesthesia Other Findings   Reproductive/Obstetrics negative OB ROS                           Anesthesia Physical Anesthesia Plan  ASA: III  Anesthesia Plan: Spinal   Post-op Pain Management:    Induction:   Airway Management Planned: Simple Face Mask  Additional Equipment:   Intra-op Plan:   Post-operative Plan:   Informed Consent: I have reviewed the patients History and Physical, chart, labs and discussed the procedure including the risks, benefits and alternatives for the proposed anesthesia with the patient or authorized representative who has indicated his/her understanding and acceptance.   Dental Advisory Given  Plan Discussed with: CRNA and Surgeon  Anesthesia Plan Comments:         Anesthesia Quick Evaluation

## 2014-02-27 NOTE — Evaluation (Signed)
Physical Therapy Evaluation Patient Details Name: Francisco Howe MRN: [id number] DOB: 1946/03/13 Today's Date: 02/27/2014   History of Present Illness  RTKA  Clinical Impression  Pt tolerated well, minimal pain. Pt reports he plans to go to OP PT at [place]. Pt will benefit from PT to address problems listed in note .    Follow Up Recommendations Outpatient PT (pt states he is going to OPPT)    Equipment Recommendations  None recommended by PT    Recommendations for Other Services       Precautions / Restrictions Precautions Precautions: Knee;Fall Restrictions Weight Bearing Restrictions: No      Mobility  Bed Mobility Overal bed mobility: Needs Assistance Bed Mobility: Supine to Sit     Supine to sit: Min guard     General bed mobility comments: cues for safety  Transfers Overall transfer level: Needs assistance Equipment used: Rolling walker (2 wheeled) Transfers: Sit to/from Stand Sit to Stand: Min assist         General transfer comment: cues for hand and R leg position,  Ambulation/Gait Ambulation/Gait assistance: Min assist Ambulation Distance (Feet): 80 Feet Assistive device: Rolling walker (2 wheeled) Gait Pattern/deviations: Step-to pattern;Step-through pattern;Antalgic     General Gait Details: cues for sequence  Stairs            Wheelchair Mobility    Modified Rankin (Stroke Patients Only)       Balance                                             Pertinent Vitals/Pain < 3    Home Living Family/patient expects to be discharged to:: Private residence Living Arrangements: Other relatives Available Help at Discharge: Friend(s);Family;Available PRN/intermittently Type of Home: Mobile home Home Access: Stairs to enter Entrance Stairs-Rails: None Entrance Stairs-Number of Steps: 3 Home Layout: One level Home Equipment: Cane - single point;Walker - 2 wheels;Crutches (forearm) Additional Comments: pt reports he has  been practicing with forearm crutches.    Prior Function Level of Independence: Independent               Hand Dominance        Extremity/Trunk Assessment   Upper Extremity Assessment: Overall WFL for tasks assessed           Lower Extremity Assessment: RLE deficits/detail RLE Deficits / Details: able to perform SLR       Communication   Communication: No difficulties  Cognition Arousal/Alertness: Awake/alert Behavior During Therapy: WFL for tasks assessed/performed Overall Cognitive Status: Within Functional Limits for tasks assessed                      General Comments      Exercises Total Joint Exercises Straight Leg Raises: AAROM;Right;5 reps;Supine      Assessment/Plan    PT Assessment Patient needs continued PT services  PT Diagnosis Difficulty walking   PT Problem List Decreased strength;Decreased range of motion;Decreased activity tolerance;Decreased mobility;Decreased knowledge of use of DME;Decreased safety awareness;Decreased knowledge of precautions  PT Treatment Interventions DME instruction;Gait training;Stair training;Functional mobility training;Therapeutic exercise;Therapeutic activities;Patient/family education   PT Goals (Current goals can be found in the Care Plan section) Acute Rehab PT Goals Patient Stated Goal: to go to OPPT friday PT Goal Formulation: With patient Time For Goal Achievement: 03/01/14 Potential to Achieve Goals: Good    Frequency 7X/week  Barriers to discharge        Co-evaluation               End of Session Equipment Utilized During Treatment: Gait belt Activity Tolerance: Patient tolerated treatment well Patient left: in chair;with call bell/phone within reach Nurse Communication: Mobility status         Time: 1600-1620 PT Time Calculation (min): 20 min   Charges:   PT Evaluation $Initial PT Evaluation Tier I: 1 Procedure PT Treatments $Gait Training: 8-22 mins   PT G Codes:           Francisco Howe 02/27/2014, 5:41 PM

## 2014-02-27 NOTE — Anesthesia Procedure Notes (Signed)
Spinal  Patient location during procedure: OR End time: 02/27/2014 10:13 AM Staffing Anesthesiologist: Rod Mae L CRNA/Resident: Lajuana Carry E Performed by: anesthesiologist  Preanesthetic Checklist Completed: patient identified, site marked, surgical consent, pre-op evaluation, timeout performed, IV checked, risks and benefits discussed and monitors and equipment checked Spinal Block Patient position: sitting Prep: Betadine Patient monitoring: heart rate, continuous pulse ox and blood pressure Approach: right paramedian Location: L3-4 Injection technique: single-shot Needle Needle type: Spinocan  Needle gauge: 22 G Needle length: 9 cm Additional Notes Time out performed, kit expiration checked/ok, sitting position, sterile prep and drape. Attempt midline w/o success x 3, Dr. Landry Dyke attempt rt paramedian with clear CSF, neg heme, pre/post injection. Pt tol well. Return to supine

## 2014-02-27 NOTE — Op Note (Signed)
NAME:  Francisco Howe                      MEDICAL RECORD NO.:  [id number]                             FACILITY:  [facility]      PHYSICIAN:  Pietro Cassis. Alvan Dame, M.D.  DATE OF BIRTH:  July 12, 1946      DATE OF PROCEDURE:  02/27/2014                                     OPERATIVE REPORT         PREOPERATIVE DIAGNOSIS:  Right knee osteoarthritis.      POSTOPERATIVE DIAGNOSIS:  Right knee osteoarthritis.      FINDINGS:  The patient was noted to have complete loss of cartilage and   bone-on-bone arthritis with associated osteophytes in all three compartments of   the knee with a significant synovitis and associated effusion.      PROCEDURE:  Right total knee replacement.      COMPONENTS USED:  DePuy rotating platform posterior stabilized knee   system, a size 4 femur, 4 tibia, 12.5 mm insert, and 41 patellar   button.      SURGEON:  Pietro Cassis. Alvan Dame, M.D.      ASSISTANT:  Danae Orleans, PA-C.      ANESTHESIA:  Spinal.      SPECIMENS:  None.      COMPLICATION:  None.      DRAINS:  None.  EBL: <100cc      TOURNIQUET TIME:   Total Tourniquet Time Documented: Thigh (Right) - 61 minutes Total: Thigh (Right) - 61 minutes  .      The patient was stable to the recovery room.      INDICATION FOR PROCEDURE:  Francisco Howe is a 68 y.o. male patient of   mine.  The patient had been seen, evaluated, and treated conservatively in the   office with medication, activity modification, and injections.  The patient had   radiographic changes of bone-on-bone arthritis with endplate sclerosis and osteophytes noted.      The patient failed conservative measures including medication, injections, and activity modification, and at this point was ready for more definitive measures.   Based on the radiographic changes and failed conservative measures, the patient   decided to proceed with total knee replacement.  Risks of infection,   DVT, component failure, need for revision surgery, postop course, and   expectations were all   discussed and reviewed.  Consent was obtained for benefit of pain   relief.      PROCEDURE IN DETAIL:  The patient was brought to the operative theater.   Once adequate anesthesia, preoperative antibiotics, 2 gm of Ancef administered, the patient was positioned supine with the right thigh tourniquet placed.  The  right lower extremity was prepped and draped in sterile fashion.  A time-   out was performed identifying the patient, planned procedure, and   extremity.      The right lower extremity was placed in the [facility] leg holder.  The leg was   exsanguinated, tourniquet elevated to 250 mmHg.  A midline incision was   made followed by median parapatellar arthrotomy.  Following initial   exposure, attention was first directed to the patella.  Precut   measurement was noted to be 23 mm.  I resected down to 14-15 mm and used a   41 patellar button to restore patellar height as well as cover the cut   surface.      The lug holes were drilled and a metal shim was placed to protect the   patella from retractors and saw blades.      At this point, attention was now directed to the femur.  The femoral   canal was opened with a drill, irrigated to try to prevent fat emboli.  An   intramedullary rod was passed at 5 degrees valgus, 12 mm of bone was   resected off the distal femur due to pre-operative flexion contraction.  Following this resection, the tibia was   subluxated anteriorly.  Using the extramedullary guide, 2 mm of bone was resected off   the proximal medial tibia.  We confirmed the gap would be   stable medially and laterally with a 10 mm insert as well as confirmed   the cut was perpendicular in the coronal plane, checking with an alignment rod.      Once this was done, I sized the femur to be a size 4 in the anterior-   posterior dimension, chose a standard component based on medial and   lateral dimension.  The size 4 rotation block was then pinned in    position anterior referenced using the C-clamp to set rotation.  The   anterior, posterior, and  chamfer cuts were made without difficulty nor   notching making certain that I was along the anterior cortex to help   with flexion gap stability.  At this time I spent time to remove posterior osteophytes off the femur and tibia, attempting to remove a large osteophyte laterally but felt that it was too intimately associated with posterior musculature so I decided I would be best to leave it in place.     The final box cut was made off the lateral aspect of distal femur.      At this point, the tibia was sized to be a size 4, the size 4 tray was   then pinned in position through the medial third of the tubercle,   drilled, and keel punched.  I removed large medial and posterior medial tibial osteophytes as well.  Trial reduction was now carried with a 4 femur,  4 tibia, a 10 then 12.5 mm insert, and the 41 patella botton.  The knee was brought to   extension, full extension with good flexion stability with the patella   tracking through the trochlea without application of pressure.  Given   all these findings, the trial components removed.  Final components were   opened and cement was mixed.  The knee was irrigated with normal saline   solution and pulse lavage.  The synovial lining was   then injected with 20cc of Exparel, 30cc 0.25% Marcaine with epinephrine and 1 cc of Toradol,   total of 61 cc.      The knee was irrigated.  Final implants were then cemented onto clean and   dried cut surfaces of bone with the knee brought to extension with a 12.5 mm trial insert.      Once the cement had fully cured, the excess cement was removed   throughout the knee.  I confirmed I was satisfied with the range of   motion and stability, and the final 12.5 mm PS insert  was chosen.  It was   placed into the knee.      The tourniquet had been let down at 61 minutes.  No significant   hemostasis required.   The   extensor mechanism was then reapproximated using #1 Vicryl and #0 V-lock with the knee   in flexion.  Following closure of the capsule we injected 2gm of Tranexamic acid combined with 50cc of saline.  The   remaining wound was closed with 2-0 Vicryl and running 4-0 Monocryl.   The knee was cleaned, dried, dressed sterilely using Dermabond and   Aquacel dressing.  The patient was then   brought to recovery room in stable condition, tolerating the procedure   well.   Please note that Physician Assistant, Danae Orleans, PA-C, was present for the entirety of the case, and was utilized for pre-operative positioning, peri-operative retractor management, general facilitation of the procedure.  He was also utilized for primary wound closure at the end of the case.              Pietro Cassis Alvan Dame, M.D.    02/27/2014 12:11 PM

## 2014-02-27 NOTE — Transfer of Care (Signed)
Immediate Anesthesia Transfer of Care Note  Patient: Francisco Howe  Procedure(s) Performed: Procedure(s) (LRB): RIGHT TOTAL KNEE ARTHROPLASTY (Right)  Patient Location: PACU  Anesthesia Type: Spinal  Level of Consciousness: sedated, patient cooperative and responds to stimulation  Airway & Oxygen Therapy: Patient Spontanous Breathing and Patient connected to face mask oxgen  Post-op Assessment: Report given to PACU RN and Post -op Vital signs reviewed and stable  Post vital signs: Reviewed and stable  Complications: No apparent anesthesia complications

## 2014-02-27 NOTE — Anesthesia Postprocedure Evaluation (Signed)
  Anesthesia Post-op Note  Patient: Francisco Howe  Procedure(s) Performed: Procedure(s) (LRB): RIGHT TOTAL KNEE ARTHROPLASTY (Right)  Patient Location: PACU  Anesthesia Type: Spinal  Level of Consciousness: awake and alert   Airway and Oxygen Therapy: Patient Spontanous Breathing  Post-op Pain: mild  Post-op Assessment: Post-op Vital signs reviewed, Patient's Cardiovascular Status Stable, Respiratory Function Stable, Patent Airway and No signs of Nausea or vomiting  Last Vitals:  Filed Vitals:   02/27/14 1556  BP: 130/84  Pulse: 71  Temp: 36.5 C  Resp: 16    Post-op Vital Signs: stable   Complications: No apparent anesthesia complications

## 2014-02-28 DIAGNOSIS — E871 Hypo-osmolality and hyponatremia: Secondary | ICD-10-CM | POA: Diagnosis not present

## 2014-02-28 DIAGNOSIS — D62 Acute posthemorrhagic anemia: Secondary | ICD-10-CM | POA: Diagnosis not present

## 2014-02-28 DIAGNOSIS — E669 Obesity, unspecified: Secondary | ICD-10-CM | POA: Diagnosis present

## 2014-02-28 LAB — CBC
HCT: 36 % — ABNORMAL LOW (ref 39.0–52.0)
Hemoglobin: 12.1 g/dL — ABNORMAL LOW (ref 13.0–17.0)
MCH: 30.7 pg (ref 26.0–34.0)
MCHC: 33.6 g/dL (ref 30.0–36.0)
MCV: 91.4 fL (ref 78.0–100.0)
Platelets: 172 10*3/uL (ref 150–400)
RBC: 3.94 MIL/uL — ABNORMAL LOW (ref 4.22–5.81)
RDW: 14.4 % (ref 11.5–15.5)
WBC: 6.8 10*3/uL (ref 4.0–10.5)

## 2014-02-28 LAB — BASIC METABOLIC PANEL
ANION GAP: 12 (ref 5–15)
BUN: 11 mg/dL (ref 6–23)
CO2: 24 mEq/L (ref 19–32)
CREATININE: 0.63 mg/dL (ref 0.50–1.35)
Calcium: 8.2 mg/dL — ABNORMAL LOW (ref 8.4–10.5)
Chloride: 98 mEq/L (ref 96–112)
GFR calc Af Amer: >90 mL/min (ref >90)
Glucose, Bld: 170 mg/dL — ABNORMAL HIGH (ref 70–99)
Potassium: 4.4 mEq/L (ref 3.7–5.3)
SODIUM: 134 meq/L — AB (ref 137–147)

## 2014-02-28 LAB — GLUCOSE, CAPILLARY
GLUCOSE-CAPILLARY: 283 mg/dL — AB (ref 70–99)
GLUCOSE-CAPILLARY: 219 mg/dL — AB (ref 70–99)
GLUCOSE-CAPILLARY: 260 mg/dL — AB (ref 70–99)
Glucose-Capillary: 189 mg/dL — ABNORMAL HIGH (ref 70–99)

## 2014-02-28 MED ORDER — HYDROCODONE-ACETAMINOPHEN 7.5-325 MG PO TABS: 1.0000 | ORAL_TABLET | ORAL | Status: DC | PRN

## 2014-02-28 MED ORDER — FERROUS SULFATE 325 (65 FE) MG PO TABS: 325.0000 mg | ORAL_TABLET | Freq: Three times a day (TID) | ORAL | Status: DC

## 2014-02-28 MED ORDER — ASPIRIN EC 325 MG PO TBEC
325.0000 mg | DELAYED_RELEASE_TABLET | Freq: Two times a day (BID) | ORAL | Status: DC
Start: 2014-02-28 — End: 2014-02-28

## 2014-02-28 MED ORDER — POLYETHYLENE GLYCOL 3350 17 G PO PACK: 17.0000 g | PACK | Freq: Two times a day (BID) | ORAL | Status: DC

## 2014-02-28 MED ORDER — ASPIRIN EC 325 MG PO TBEC: 325.0000 mg | DELAYED_RELEASE_TABLET | Freq: Two times a day (BID) | ORAL | Status: AC

## 2014-02-28 MED ORDER — RIVAROXABAN 10 MG PO TABS: 10.0000 mg | ORAL_TABLET | ORAL | Status: DC

## 2014-02-28 MED ORDER — DSS 100 MG PO CAPS: 100.0000 mg | ORAL_CAPSULE | Freq: Two times a day (BID) | ORAL | Status: DC

## 2014-02-28 MED ORDER — METHOCARBAMOL 500 MG PO TABS: 500.0000 mg | ORAL_TABLET | Freq: Four times a day (QID) | ORAL | Status: DC | PRN

## 2014-02-28 NOTE — Discharge Instructions (Signed)
Information on my medicine - XARELTO (Rivaroxaban)  This medication education was reviewed with me or my healthcare representative as part of my discharge preparation.  The pharmacist that spoke with me during my hospital stay was:  Mandie Crabbe, Julieta Bellini, RPH  Why was Xarelto prescribed for you? Xarelto was prescribed for you to reduce the risk of blood clots forming after orthopedic surgery. The medical term for these abnormal blood clots is venous thromboembolism (VTE).  What do you need to know about xarelto ? Take your Xarelto ONCE DAILY at the same time every day. You [place] take it either with or without food.  If you have difficulty swallowing the tablet whole, you [place] crush it and mix in applesauce just prior to taking your dose.  Take Xarelto exactly as prescribed by your doctor and DO NOT stop taking Xarelto without talking to the doctor who prescribed the medication.  Stopping without other VTE prevention medication to take the place of Xarelto [place] increase your risk of developing a clot.  After discharge, you should have regular check-up appointments with your healthcare provider that is prescribing your Xarelto.    What do you do if you miss a dose? If you miss a dose, take it as soon as you remember on the same day then continue your regularly scheduled once daily regimen the next day. Do not take two doses of Xarelto on the same day.   Important Safety Information A possible side effect of Xarelto is bleeding. You should call your healthcare provider right away if you experience any of the following:   Bleeding from an injury or your nose that does not stop.   Unusual colored urine (red or dark brown) or unusual colored stools (red or black).   Unusual bruising for unknown reasons.   A serious fall or if you hit your head (even if there is no bleeding).  Some medicines Propes interact with Xarelto and might increase your risk of bleeding while on Xarelto. To help avoid  this, consult your healthcare provider or pharmacist prior to using any new prescription or non-prescription medications, including herbals, vitamins, non-steroidal anti-inflammatory drugs (NSAIDs) and supplements.  This website has more information on Xarelto: [email or web address]

## 2014-02-28 NOTE — Progress Notes (Signed)
Inpatient Diabetes Program Recommendations  AACE/ADA: New Consensus Statement on Inpatient Glycemic Control (2013)  Target Ranges:  Prepandial:   less than 140 mg/dL      Peak postprandial:   less than 180 mg/dL (1-2 hours)      Critically ill patients:  140 - 180 mg/dL   Reason for Visit: Hyperglycemia  Likely d/t steroids. Needs correction insulin.  Results for Francisco Howe, Francisco Howe (MRN [id number]) as of 02/28/2014 12:49  Ref. Range 02/28/2014 07:10 02/28/2014 11:46  Glucose-Capillary Latest Range: 70-99 mg/dL 189 (H) 283 (H)    Inpatient Diabetes Program Recommendations Correction (SSI): Add Novolog resistant tidwc and hs  Thank you. Lorenda Peck, RD, LDN, CDE Inpatient Diabetes Coordinator [phone]

## 2014-02-28 NOTE — Progress Notes (Signed)
CARE MANAGEMENT NOTE 02/28/2014  Patient:  Francisco Howe, Francisco Howe   Account Number:  [id number]  Date Initiated:  02/28/2014  Documentation initiated by:  DAVIS,RHONDA  Subjective/Objective Assessment:   right total knee arthroplasty     Action/Plan:   [facility]pt has appoint. set up/has all dme in the home needed   Anticipated DC Date:  03/02/2014   Anticipated DC Plan:  [place] referral  NA      DC Planning Services  CM consult      [facility] Choice  NA   Choice offered to / List presented to:  C-1 Patient   DME arranged  [place]      DME agency  [facility]     [place] arranged  HH-2 PT      [place] agency  [facility]   Status of service:  In process, will continue to follow Medicare Important Message given?  NA - LOS <3 / Initial given by admissions (If response is "NO", the following Medicare IM given date fields will be blank) Date Medicare IM given:   Medicare IM given by:   Date Additional Medicare IM given:   Additional Medicare IM given by:    Discharge Disposition:    Per UR Regulation:  Reviewed for med. necessity/level of care/duration of stay  If discussed at [place] of Stay Meetings, dates discussed:    Comments:  [id number]/Rhonda Eldridge Dace, [place], [place] [phone] Chart Reviewed for discharge and hospital needs. Discharge needs at time of review: None present will follow for needs. Review of patient progress due on [id number]

## 2014-02-28 NOTE — Progress Notes (Signed)
Physical Therapy Treatment Patient Details Name: Francisco Howe MRN: [id number] DOB: 12-27-1945 Today's Date: 02/28/2014    History of Present Illness RTKA    PT Comments    POD # 1 am session.  Pt able to perform active SLR and get self to EOB.  Feeling good.  Some c/o about poor sleep last night.  Amb in hallway a good distance then returned to recliner to perform TKR TE's following handout HEP.  Applied ICE and instructed on proper use.  Pt plans to D/C later today after 2nd session to address stairs.    Follow Up Recommendations  Outpatient PT     Equipment Recommendations  None recommended by PT    Recommendations for Other Services       Precautions / Restrictions Precautions Precautions: Knee;Fall Restrictions Weight Bearing Restrictions: No    Mobility  Bed Mobility Overal bed mobility: Modified Independent             General bed mobility comments: increased time  Transfers Overall transfer level: Needs assistance Equipment used: Rolling walker (2 wheeled) Transfers: Sit to/from Stand Sit to Stand: Supervision         General transfer comment: good safety cognition  Ambulation/Gait Ambulation/Gait assistance: Min guard;Supervision Ambulation Distance (Feet): 125 Feet Assistive device: Rolling walker (2 wheeled) Gait Pattern/deviations: Step-to pattern;Step-through pattern Gait velocity: WFL   General Gait Details: one VC for safety with proper walker to self distance and turns   Financial trader Rankin (Stroke Patients Only)       Balance                                    Cognition                            Exercises   Total Knee Replacement TE's 10 reps B LE ankle pumps 10 reps towel squeezes 10 reps knee presses 10 reps heel slides  10 reps SAQ's 10 reps SLR's 10 reps ABD Followed by ICE     General Comments        Pertinent Vitals/Pain C/o 3/10 Pre  medicated ICE applied    Home Living                      Prior Function            PT Goals (current goals can now be found in the care plan section) Progress towards PT goals: Progressing toward goals    Frequency  7X/week    PT Plan      Co-evaluation             End of Session Equipment Utilized During Treatment: Gait belt Activity Tolerance: Patient tolerated treatment well Patient left: in chair;with call bell/phone within reach     Time: 5465-0354 PT Time Calculation (min): 30 min  Charges:  $Gait Training: 8-22 mins $Therapeutic Exercise: 8-22 mins                    G Codes:      Rica Koyanagi  PTA [facility]  Acute  Rehab Pager      [phone]

## 2014-02-28 NOTE — Progress Notes (Signed)
Physical Therapy Treatment Patient Details Name: Francisco Howe MRN: [id number] DOB: February 10, 1946 Today's Date: 02/28/2014    History of Present Illness RTKA    PT Comments    Pt tolerated well. DC planned tomorrow  Follow Up Recommendations  Outpatient PT     Equipment Recommendations  None recommended by PT    Recommendations for Other Services       Precautions / Restrictions Precautions Precautions: Knee;Fall Restrictions Weight Bearing Restrictions: No    Mobility  Bed Mobility Overal bed mobility: Modified Independent             General bed mobility comments: increased time  Transfers Overall transfer level: Needs assistance Equipment used: Rolling walker (2 wheeled) Transfers: Sit to/from Stand Sit to Stand: Supervision         General transfer comment: cues for safety  Ambulation/Gait Ambulation/Gait assistance: Min guard;Supervision Ambulation Distance (Feet): 100 Feet Assistive device: Rolling walker (2 wheeled) Gait Pattern/deviations: Step-through pattern Gait velocity: WFL   General Gait Details: one VC for safety with proper walker to self distance and turns   Stairs Stairs: Yes Stairs assistance: Min assist Stair Management: No rails;Step to pattern;Forwards;With crutches Number of Stairs: 5 General stair comments: cues for safety  Wheelchair Mobility    Modified Rankin (Stroke Patients Only)       Balance                                    Cognition Arousal/Alertness: Awake/alert                          Exercises      General Comments        Pertinent Vitals/Pain 0    Home Living                      Prior Function            PT Goals (current goals can now be found in the care plan section) Progress towards PT goals: Progressing toward goals    Frequency  7X/week    PT Plan Current plan remains appropriate    Co-evaluation             End of Session  Equipment Utilized During Treatment: Gait belt Activity Tolerance: Patient tolerated treatment well Patient left: with call bell/phone within reach;in bed     Time: 1400-1423 PT Time Calculation (min): 23 min  Charges:  $Gait Training: 23-37 mins $Therapeutic Exercise: 8-22 mins                    G Codes:      Francisco Howe 02/28/2014, 3:48 PM

## 2014-02-28 NOTE — Progress Notes (Signed)
   Subjective: 1 Day Post-Op Procedure(s) (LRB): RIGHT TOTAL KNEE ARTHROPLASTY (Right)   Patient reports pain as mild, pain controlled. No events throughout the night.   Objective:   VITALS:   Filed Vitals:   02/28/14 0225  BP: 121/80  Pulse: 69  Temp: 98.6 F (37 C)  Resp: 16    Dorsiflexion/Plantar flexion intact Incision: dressing C/D/I No cellulitis present Compartment soft  LABS  Recent Labs  02/28/14 0450  HGB 12.1*  HCT 36.0*  WBC 6.8  PLT 172     Recent Labs  02/28/14 0450  NA 134*  K 4.4  BUN 11  CREATININE 0.63  GLUCOSE 170*     Assessment/Plan: 1 Day Post-Op Procedure(s) (LRB): RIGHT TOTAL KNEE ARTHROPLASTY (Right) Foley cath d/c'ed Advance diet Up with therapy D/C IV fluids Discharge home with home health eventually, when ready  Expected ABLA  Treated with iron and will observe  Obese (BMI 30-39.9) Estimated body mass index is 31.53 kg/(m^2) as calculated from the following:   Height as of this encounter: 5\' 11"  (1.803 m).   Weight as of this encounter: 102.513 kg (226 lb). Patient also counseled that weight Soyars inhibit the healing process Patient counseled that losing weight will help with future health issues  Hyponatremia Treated with IV fluids and will observe       Francisco Howe   PAC  02/28/2014, 8:38 AM

## 2014-02-28 NOTE — Progress Notes (Signed)
OT Cancellation Note  Patient Details Name: Francisco Howe MRN: [id number] DOB: [birth year]   Cancelled Treatment:    Reason Eval/Treat Not Completed: OT screened, no needs identified, will sign off - pt reports he has all DME and AE at home and has been practicing with them prior to surgery.  He reports he will also have necessary assistance at home and doesn't feel he needs OT.   Darlina Rumpf Katherine, OTR/L [phone]  02/28/2014, 11:18 AM

## 2014-03-01 LAB — CBC
HCT: 35.5 % — ABNORMAL LOW (ref 39.0–52.0)
Hemoglobin: 12.2 g/dL — ABNORMAL LOW (ref 13.0–17.0)
MCH: 31.1 pg (ref 26.0–34.0)
MCHC: 34.4 g/dL (ref 30.0–36.0)
MCV: 90.6 fL (ref 78.0–100.0)
Platelets: 190 10*3/uL (ref 150–400)
RBC: 3.92 MIL/uL — ABNORMAL LOW (ref 4.22–5.81)
RDW: 14.5 % (ref 11.5–15.5)
WBC: 9.4 10*3/uL (ref 4.0–10.5)

## 2014-03-01 LAB — BASIC METABOLIC PANEL
Anion gap: 13 (ref 5–15)
BUN: 11 mg/dL (ref 6–23)
CALCIUM: 9.6 mg/dL (ref 8.4–10.5)
CO2: 23 mEq/L (ref 19–32)
Chloride: 99 mEq/L (ref 96–112)
Creatinine, Ser: 0.67 mg/dL (ref 0.50–1.35)
GLUCOSE: 156 mg/dL — AB (ref 70–99)
Potassium: 4.5 mEq/L (ref 3.7–5.3)
SODIUM: 135 meq/L — AB (ref 137–147)

## 2014-03-01 LAB — GLUCOSE, CAPILLARY: Glucose-Capillary: 173 mg/dL — ABNORMAL HIGH (ref 70–99)

## 2014-03-01 NOTE — Progress Notes (Signed)
Patient ID: Francisco Howe, male   DOB: [birth year], 68 y.o.   MRN: [id number] Subjective: 2 Days Post-Op Procedure(s) (LRB): RIGHT TOTAL KNEE ARTHROPLASTY (Right)    Patient reports pain as mild.  Doing very well.  Appreciative   Objective:   VITALS:   Filed Vitals:   03/01/14 0623  BP: 123/69  Pulse: 67  Temp: 98.5 F (36.9 C)  Resp: 16    Neurovascular intact Incision: dressing C/D/I  LABS  Recent Labs  02/28/14 0450 03/01/14 0440  HGB 12.1* 12.2*  HCT 36.0* 35.5*  WBC 6.8 9.4  PLT 172 190     Recent Labs  02/28/14 0450 03/01/14 0440  NA 134* 135*  K 4.4 4.5  BUN 11 11  CREATININE 0.63 0.67  GLUCOSE 170* 156*    No results found for this basename: LABPT, INR,  in the last 72 hours   Assessment/Plan: 2 Days Post-Op Procedure(s) (LRB): RIGHT TOTAL KNEE ARTHROPLASTY (Right)   Up with therapy Discharge home with home health after therapy this am RTC 2 weeks

## 2014-03-01 NOTE — Progress Notes (Signed)
Pt to d/c home. Has DME at home. AVS reviewed and "My Chart" discussed with pt. Pt capable of verbalizing medications, signs and symptoms of infection, and follow-up appointments. Remains hemodynamically stable. No signs and symptoms of distress. Educated pt to return to ER in the case of SOB, dizziness, or chest pain.

## 2014-03-01 NOTE — Progress Notes (Signed)
Physical Therapy Treatment Patient Details Name: Francisco Howe MRN: [id number] DOB: 10-Aug-1945 Today's Date: 03-05-2014    History of Present Illness RTKA    PT Comments    Progressing well and eager for d/c  Follow Up Recommendations  Outpatient PT     Equipment Recommendations  None recommended by PT    Recommendations for Other Services       Precautions / Restrictions Precautions Precautions: Knee;Fall Restrictions Weight Bearing Restrictions: No    Mobility  Bed Mobility Overal bed mobility: Modified Independent Bed Mobility: Supine to Sit;Sit to Supine     Supine to sit: Modified independent (Device/Increase time) Sit to supine: Modified independent (Device/Increase time)      Transfers Overall transfer level: Modified independent Equipment used: Rolling walker (2 wheeled) Transfers: Sit to/from Stand Sit to Stand: Modified independent (Device/Increase time)         General transfer comment: cues for safety  Ambulation/Gait Ambulation/Gait assistance: Supervision Ambulation Distance (Feet): 50 Feet Assistive device: Rolling walker (2 wheeled) Gait Pattern/deviations: Step-to pattern;Step-through pattern;Trunk flexed Gait velocity: WFL   General Gait Details: cues for posture and position from RW; pt to/from bathroom including performing grooming tasks at sink without assist   Stairs         General stair comments: Pt states he is comfortable with ability on stairs and declines to reattempt  Wheelchair Mobility    Modified Rankin (Stroke Patients Only)       Balance                                    Cognition Arousal/Alertness: Awake/alert Behavior During Therapy: WFL for tasks assessed/performed Overall Cognitive Status: Within Functional Limits for tasks assessed                      Exercises Total Joint Exercises Ankle Circles/Pumps: AROM;Both;10 reps;Supine Quad Sets: AROM;Both;10 reps;Supine Towel  Squeeze: AROM;Both;10 reps;Supine Heel Slides: AAROM;20 reps;Supine;Right Hip ABduction/ADduction: AROM;Right;10 reps;Supine Straight Leg Raises: Right;Supine;AROM;10 reps Long Arc Quad: AAROM;AROM;Right;10 reps;Seated Knee Flexion: AROM;AAROM;15 reps;Right;Seated    General Comments        Pertinent Vitals/Pain 4/10; premed, cold packs provided    Home Living                      Prior Function            PT Goals (current goals can now be found in the care plan section) Acute Rehab PT Goals Patient Stated Goal: to go to OPPT friday PT Goal Formulation: With patient Time For Goal Achievement: 03-05-2014 Potential to Achieve Goals: Good Progress towards PT goals: Progressing toward goals    Frequency  7X/week    PT Plan Current plan remains appropriate    Co-evaluation             End of Session   Activity Tolerance: Patient tolerated treatment well Patient left: with call bell/phone within reach;in bed     Time: 0350-0938 PT Time Calculation (min): 43 min  Charges:  $Therapeutic Exercise: 23-37 mins $Therapeutic Activity: 8-22 mins                    G Codes:      Francisco Howe 05-Mar-2014, 12:07 PM

## 2014-03-05 DIAGNOSIS — M25569 Pain in unspecified knee: Secondary | ICD-10-CM | POA: Diagnosis not present

## 2014-03-08 DIAGNOSIS — M25569 Pain in unspecified knee: Secondary | ICD-10-CM | POA: Diagnosis not present

## 2014-03-08 NOTE — Discharge Summary (Signed)
Physician Discharge Summary  Patient ID: Francisco Howe MRN: [id number] DOB/AGE: September 05, 1945 68 y.o.  Admit date: 02/27/2014 Discharge date: 03/01/2014   Procedures:  Procedure(s) (LRB): RIGHT TOTAL KNEE ARTHROPLASTY (Right)  Attending Physician:  Dr. Paralee Cancel   Admission Diagnoses:   Right knee OA / pain  Discharge Diagnoses:  Principal Problem:   S/P right TKA Active Problems:   Obese   Postoperative anemia due to acute blood loss   Hyponatremia  Past Medical History  Diagnosis Date  . Diabetes mellitus   . Hypertension   . Hypercholesteremia   . CAD (coronary artery disease)     a. s/p CABG 02/2003:  L-Dx, L radial-OM1, S-OM2/dCFX, S-RCA;  b.  Myoview 12/09: no ischemia, EF 59%  . Carotid stenosis     a. dopplers 2/35:  RICA 3-61%, LICA 44-31%  . Gout   . Arthritis     RA - MOSTLY IN HANDS; OA AND PAIN BOTH KNEES - RIGHT KNEE PAIN WORSE  . Complication of anesthesia     PT TOLD THAT DURING HIS HEART SURGERY - BY DR. Servando Snare - HE HAD PROFUSE SWEATING AND  AWARENESS DURING THE SURGERY AND REMEMBERS FEELING OF HEAVINESS IN CHEST  . Cancer     SKIN CANCER INSIDE OF LEFT EAR REMOVED  . Hx of lichen planus     RIGHT LOWER LEG AND GUMS - TREATED - NO PROBLEMS AT PRESENT TIME 02/20/14  . Hernia, umbilical     PT STATES HE HAS UMBILICAL HERNIA - NO C/O OF PAIN     HPI: Francisco Howe, 68 y.o. male, has a history of pain and functional disability in the right knee due to arthritis and has failed non-surgical conservative treatments for greater than 12 weeks to include NSAID's and/or analgesics, corticosteriod injections and activity modification. Onset of symptoms was gradual, starting >10 years ago with gradually worsening course since that time. The patient noted prior procedures on the knee to include arthroscopy on the right knee(s). Patient currently rates pain in the right knee(s) at 7 out of 10 with activity. Patient has worsening of pain with activity and weight bearing,  pain that interferes with activities of daily living, pain with passive range of motion, crepitus and joint swelling. Patient has evidence of periarticular osteophytes and joint space narrowing by imaging studies. There is no active infection. Risks, benefits and expectations were discussed with the patient. Risks including but not limited to the risk of anesthesia, blood clots, nerve damage, blood vessel damage, failure of the prosthesis, infection and up to and including death. Patient understand the risks, benefits and expectations and wishes to proceed with surgery.  PCP: Pcp Not In System   Discharged Condition: good  Hospital Course:  Patient underwent the above stated procedure on 02/27/2014. Patient tolerated the procedure well and brought to the recovery room in good condition and subsequently to the floor.  POD #1 BP: 121/80 ; Pulse: 69 ; Temp: 98.6 F (37 C) ; Resp: 16  Patient reports pain as mild, pain controlled. No events throughout the night.  Dorsiflexion/plantar flexion intact, incision: dressing C/D/I, no cellulitis present and compartment soft.   LABS  Basename    HGB  12.1  HCT  36.0   POD #2  BP: 123/69 ; Pulse: 67 ; Temp: 98.5 F (36.9 C) ; Resp: 16  Patient reports pain as mild. Doing very well. Appreciative. Ready to be discharged home. Dorsiflexion/plantar flexion intact, incision: dressing C/D/I, no cellulitis present and  compartment soft.   LABS  Basename    HGB  12.2  HCT  35.5    Discharge Exam: General appearance: alert, cooperative and no distress Extremities: Homans sign is negative, no sign of DVT, no edema, redness or tenderness in the calves or thighs and no ulcers, gangrene or trophic changes  Disposition: Home with follow up in 2 weeks   Follow-up Information   Follow up with Mauri Pole, MD. Schedule an appointment as soon as possible for a visit in 2 weeks.   Specialty:  Orthopedic Surgery   Contact information:   [address] [place] [address] [phone]       Discharge Instructions   Call MD / Call 911    Complete by:  As directed   If you experience chest pain or shortness of breath, CALL 911 and be transported to the hospital emergency room.  If you develope a fever above 101 F, pus (white drainage) or increased drainage or redness at the wound, or calf pain, call your surgeon's office.     Change dressing    Complete by:  As directed   Maintain surgical dressing for 10-14 days, or until follow up in the clinic.     Constipation Prevention    Complete by:  As directed   Drink plenty of fluids.  Prune juice [place]er be helpful.  You Zackery use a stool softener, such as Colace (over the counter) 100 mg twice a day.  Use MiraLax (over the counter) for constipation as needed.     Diet - low sodium heart healthy    Complete by:  As directed      Discharge instructions    Complete by:  As directed   Maintain surgical dressing for 10-14 days, or until follow up in the clinic. Follow [facility] in 2 weeks at [facility]. Call with any questions or concerns.     Driving restrictions    Complete by:  As directed   No driving for 4 weeks     Increase activity slowly as tolerated    Complete by:  As directed      TED hose    Complete by:  As directed   Use stockings (TED hose) for 2 weeks on both leg(s).  You Lapiana remove them at night for sleeping.     Weight bearing as tolerated    Complete by:  As directed   Laterality:  right  Extremity:  Lower             Medication List    STOP taking these medications       methotrexate 2.5 MG tablet  Commonly known as:  RHEUMATREX      TAKE these medications       allopurinol 300 MG tablet  Commonly known as:  ZYLOPRIM  Take 300 mg by mouth daily.     amLODipine 10 MG tablet  Commonly known as:  NORVASC  Take 10 mg by mouth every morning.     aspirin EC 325 MG tablet  Take 1 tablet (325 mg total) by mouth 2 (two) times daily. Take  for 4 weeks.     CALCIUM 600 + D PO  Take 1 tablet by mouth 2 (two) times daily.     cholecalciferol 1000 UNITS tablet  Commonly known as:  VITAMIN D  Take 1,000 Units by mouth daily.     clobetasol ointment 0.05 %  Commonly known as:  TEMOVATE  Apply  1 application topically 2 (two) times daily.     DSS 100 MG Caps  Take 100 mg by mouth 2 (two) times daily.     ferrous sulfate 325 (65 FE) MG tablet  Take 1 tablet (325 mg total) by mouth 3 (three) times daily after meals.     fish oil-omega-3 fatty acids 1000 MG capsule  Take 2 g by mouth daily.     folic acid 1 MG tablet  Commonly known as:  FOLVITE  Take 1 mg by mouth daily.     glipiZIDE 10 MG tablet  Commonly known as:  GLUCOTROL  Take 10 mg by mouth 2 (two) times daily before a meal.     HYDROcodone-acetaminophen 7.5-325 MG per tablet  Commonly known as:  NORCO  Take 1-2 tablets by mouth every 4 (four) hours as needed for moderate pain.     insulin detemir 100 UNIT/ML injection  Commonly known as:  LEVEMIR  Inject 10 Units into the skin at bedtime.     losartan 25 MG tablet  Commonly known as:  COZAAR  Take 12.5 mg by mouth every morning. Takes 1/2 tablet every am     metFORMIN 1000 MG tablet  Commonly known as:  GLUCOPHAGE  Take 1,000 mg by mouth 2 (two) times daily with a meal.     methocarbamol 500 MG tablet  Commonly known as:  ROBAXIN  Take 1 tablet (500 mg total) by mouth every 6 (six) hours as needed for muscle spasms.     metoprolol tartrate 25 MG tablet  Commonly known as:  LOPRESSOR  Take 12.5 mg by mouth 2 (two) times daily.     nitroGLYCERIN 0.4 MG SL tablet  Commonly known as:  NITROSTAT  Place 0.4 mg under the tongue every 5 (five) minutes as needed for chest pain.     polyethylene glycol packet  Commonly known as:  MIRALAX / GLYCOLAX  Take 17 g by mouth 2 (two) times daily.     predniSONE 5 MG tablet  Commonly known as:  DELTASONE  Take 5 mg by mouth daily with breakfast.      rivaroxaban 10 MG Tabs tablet  Commonly known as:  XARELTO  Take 1 tablet (10 mg total) by mouth daily.     tacrolimus 1 MG capsule  Commonly known as:  PROGRAF  Take 1 mg by mouth See admin instructions. Pt was using for lichen planus of gums -  No longer has the lichen planus - but has the medication to use again if needed         Signed: West Pugh. Dnaiel Voller   PA-C  03/08/2014, 11:43 AM

## 2014-03-12 DIAGNOSIS — M25569 Pain in unspecified knee: Secondary | ICD-10-CM | POA: Diagnosis not present

## 2014-03-15 DIAGNOSIS — M25569 Pain in unspecified knee: Secondary | ICD-10-CM | POA: Diagnosis not present

## 2014-03-19 DIAGNOSIS — M25569 Pain in unspecified knee: Secondary | ICD-10-CM | POA: Diagnosis not present

## 2014-03-22 DIAGNOSIS — M25569 Pain in unspecified knee: Secondary | ICD-10-CM | POA: Diagnosis not present

## 2014-03-26 DIAGNOSIS — M25569 Pain in unspecified knee: Secondary | ICD-10-CM | POA: Diagnosis not present

## 2014-03-29 DIAGNOSIS — M25569 Pain in unspecified knee: Secondary | ICD-10-CM | POA: Diagnosis not present

## 2014-04-04 DIAGNOSIS — M25569 Pain in unspecified knee: Secondary | ICD-10-CM | POA: Diagnosis not present

## 2014-04-06 DIAGNOSIS — M25569 Pain in unspecified knee: Secondary | ICD-10-CM | POA: Diagnosis not present

## 2014-04-09 DIAGNOSIS — M25569 Pain in unspecified knee: Secondary | ICD-10-CM | POA: Diagnosis not present

## 2014-04-11 DIAGNOSIS — Z96659 Presence of unspecified artificial knee joint: Secondary | ICD-10-CM | POA: Diagnosis not present

## 2014-04-11 DIAGNOSIS — Z471 Aftercare following joint replacement surgery: Secondary | ICD-10-CM | POA: Diagnosis not present

## 2014-04-13 DIAGNOSIS — M25569 Pain in unspecified knee: Secondary | ICD-10-CM | POA: Diagnosis not present

## 2014-04-17 DIAGNOSIS — M25569 Pain in unspecified knee: Secondary | ICD-10-CM | POA: Diagnosis not present

## 2014-04-19 DIAGNOSIS — M25569 Pain in unspecified knee: Secondary | ICD-10-CM | POA: Diagnosis not present

## 2014-10-29 NOTE — Progress Notes (Signed)
HPI: FU CAD; s/p OOH MI and subsequent CABG in 2004, DM2, HTN, HL. Abdominal ultrasound in December of 2008 showed no aneurysm. Carotid Dopplers 2014 at the [facility] showed mild to moderate plaque bilaterally. No significant obstruction. Nuclear study January 2015 showed ejection fraction 55%, minimally reversible defect in the inferior lateral wall. Since he was last seen, the patient denies any dyspnea on exertion, orthopnea, PND, pedal edema, palpitations, syncope or chest pain.   Current Outpatient Prescriptions  Medication Sig Dispense Refill  . allopurinol (ZYLOPRIM) 300 MG tablet Take 300 mg by mouth daily.    Marland Kitchen amLODipine (NORVASC) 10 MG tablet Take 10 mg by mouth every morning.     . Calcium Carb-Cholecalciferol (CALCIUM 600 + D PO) Take 1 tablet by mouth 2 (two) times daily.    . cholecalciferol (VITAMIN D) 1000 UNITS tablet Take 1,000 Units by mouth daily.    . clobetasol ointment (TEMOVATE) 0.25 % Apply 1 application topically 2 (two) times daily.    Marland Kitchen docusate sodium 100 MG CAPS Take 100 mg by mouth 2 (two) times daily. 10 capsule 0  . fish oil-omega-3 fatty acids 1000 MG capsule Take 2 g by mouth daily.    . folic acid (FOLVITE) 1 MG tablet Take 1 mg by mouth daily.    Marland Kitchen glipiZIDE (GLUCOTROL) 10 MG tablet Take 10 mg by mouth 2 (two) times daily before a meal.    . HYDROcodone-acetaminophen (NORCO) 7.5-325 MG per tablet Take 1-2 tablets by mouth every 4 (four) hours as needed for moderate pain. 100 tablet 0  . insulin detemir (LEVEMIR) 100 UNIT/ML injection Inject 10 Units into the skin at bedtime.    Marland Kitchen losartan (COZAAR) 25 MG tablet Take 12.5 mg by mouth every morning. Takes 1/2 tablet every am    . metFORMIN (GLUCOPHAGE) 1000 MG tablet Take 1,000 mg by mouth 2 (two) times daily with a meal.    . metoprolol tartrate (LOPRESSOR) 25 MG tablet Take 12.5 mg by mouth 2 (two) times daily.     . nitroGLYCERIN (NITROSTAT) 0.4 MG SL tablet Place 0.4 mg under the tongue every 5 (five)  minutes as needed for chest pain.    . polyethylene glycol (MIRALAX / GLYCOLAX) packet Take 17 g by mouth 2 (two) times daily. 14 each 0  . tacrolimus (PROGRAF) 1 MG capsule Take 1 mg by mouth See admin instructions. Pt was using for lichen planus of gums -  No longer has the lichen planus - but has the medication to use again if needed     No current facility-administered medications for this visit.     Past Medical History  Diagnosis Date  . Diabetes mellitus   . Hypertension   . Hypercholesteremia   . CAD (coronary artery disease)     a. s/p CABG 02/2003:  L-Dx, L radial-OM1, S-OM2/dCFX, S-RCA;  b.  Myoview 12/09: no ischemia, EF 59%  . Carotid stenosis     a. dopplers 4/27:  RICA 0-62%, LICA 37-62%  . Gout   . Arthritis     RA - MOSTLY IN HANDS; OA AND PAIN BOTH KNEES - RIGHT KNEE PAIN WORSE  . Complication of anesthesia     PT TOLD THAT DURING HIS HEART SURGERY - BY DR. Servando Snare - HE HAD PROFUSE SWEATING AND  AWARENESS DURING THE SURGERY AND REMEMBERS FEELING OF HEAVINESS IN CHEST  . Cancer     SKIN CANCER INSIDE OF LEFT EAR REMOVED  . Hx of  lichen planus     RIGHT LOWER LEG AND GUMS - TREATED - NO PROBLEMS AT PRESENT TIME 02/20/14  . Hernia, umbilical     PT STATES HE HAS UMBILICAL HERNIA - NO C/O OF PAIN     Past Surgical History  Procedure Laterality Date  . Bypass graft  2004    x5  . Back surgery    . Knee surgery    . Toe surgery      gout  . Rotator cuff repair      left  . Coronary artery bypass graft  2004  . Total knee arthroplasty Right 02/27/2014    Procedure: RIGHT TOTAL KNEE ARTHROPLASTY;  Surgeon: Mauri Pole, MD;  Location: [facility];  Service: Orthopedics;  Laterality: Right;    History   Social History  . Marital Status: Divorced    Spouse Name: N/A  . Number of Children: N/A  . Years of Education: N/A   Occupational History  . Not on file.   Social History Main Topics  . Smoking status: Former Smoker    Types: Cigarettes    Start date:  04/11/1982  . Smokeless tobacco: Never Used  . Alcohol Use: No     Comment: QUIT SMOKING AROUND 2004  . Drug Use: No  . Sexual Activity: Not on file   Other Topics Concern  . Not on file   Social History Narrative    ROS: Arthralgias but no fevers or chills, productive cough, hemoptysis, dysphasia, odynophagia, melena, hematochezia, dysuria, hematuria, rash, seizure activity, orthopnea, PND, pedal edema, claudication. Remaining systems are negative.  Physical Exam: Well-developed well-nourished in no acute distress.  Skin is warm and dry.  HEENT is normal.  Neck is supple.  Chest is clear to auscultation with normal expansion.  Cardiovascular exam is regular rate and rhythm.  Abdominal exam nontender or distended. No masses palpated. Extremities show no edema. neuro grossly intact  ECG sinus rhythm with occasional PVC. Left axis deviation. Nonspecific ST changes.

## 2014-10-30 ENCOUNTER — Ambulatory Visit (INDEPENDENT_AMBULATORY_CARE_PROVIDER_SITE_OTHER): Payer: Medicare Other | Admitting: Cardiology

## 2014-10-30 ENCOUNTER — Encounter: Payer: Self-pay | Admitting: Cardiology

## 2014-10-30 VITALS — BP 132/90 | HR 87 | Ht 72.0 in | Wt 230.6 lb

## 2014-10-30 DIAGNOSIS — I25810 Atherosclerosis of coronary artery bypass graft(s) without angina pectoris: Secondary | ICD-10-CM

## 2014-10-30 NOTE — Assessment & Plan Note (Signed)
Continue aspirin. Intolerant to statins. 

## 2014-10-30 NOTE — Assessment & Plan Note (Signed)
Continue present blood pressure medications. 

## 2014-10-30 NOTE — Assessment & Plan Note (Signed)
Followed at the [place]. Continue aspirin.

## 2014-10-30 NOTE — Patient Instructions (Signed)
Your physician wants you to follow-up in: ONE YEAR WITH DR CRENSHAW You will receive a reminder letter in the mail two months in advance. If you don't receive a letter, please call our office to schedule the follow-up appointment.  

## 2014-10-30 NOTE — Assessment & Plan Note (Signed)
Continue diet. Intolerant to statins. 

## 2015-01-04 ENCOUNTER — Encounter: Payer: Self-pay | Admitting: Family Medicine

## 2015-10-23 ENCOUNTER — Telehealth: Payer: Self-pay | Admitting: Cardiology

## 2015-10-23 ENCOUNTER — Telehealth: Payer: Self-pay | Admitting: Gynecology

## 2015-10-23 NOTE — Telephone Encounter (Signed)
Pt needs an appointment

## 2015-10-23 NOTE — Telephone Encounter (Signed)
Wrong provider

## 2015-10-24 ENCOUNTER — Ambulatory Visit (INDEPENDENT_AMBULATORY_CARE_PROVIDER_SITE_OTHER): Payer: Medicare Other | Admitting: Cardiology

## 2015-10-24 ENCOUNTER — Encounter: Payer: Self-pay | Admitting: Cardiology

## 2015-10-24 VITALS — BP 118/84 | HR 88 | Ht 72.0 in | Wt 234.0 lb

## 2015-10-24 DIAGNOSIS — I679 Cerebrovascular disease, unspecified: Secondary | ICD-10-CM | POA: Diagnosis not present

## 2015-10-24 DIAGNOSIS — I25810 Atherosclerosis of coronary artery bypass graft(s) without angina pectoris: Secondary | ICD-10-CM | POA: Diagnosis not present

## 2015-10-24 DIAGNOSIS — R0600 Dyspnea, unspecified: Secondary | ICD-10-CM | POA: Diagnosis not present

## 2015-10-24 NOTE — Progress Notes (Signed)
HPI: FU CAD; s/p OOH MI and subsequent CABG in 2004, DM2, HTN, HL. Abdominal ultrasound in December of 2008 showed no aneurysm. Nuclear study January 2015 showed ejection fraction 55%, minimally reversible defect in the inferior lateral wall. Carotid Dopplers at the [facility] in December 2016 showed no significant obstruction. Since he was last seen, He has noticed some dyspnea on exertion. He recently had a URI which is improving. He does not have orthopnea, PND, chest pain or syncope.  Current Outpatient Prescriptions  Medication Sig Dispense Refill  . allopurinol (ZYLOPRIM) 300 MG tablet Take 300 mg by mouth daily.    Marland Kitchen amLODipine (NORVASC) 10 MG tablet Take 10 mg by mouth every morning.     . Calcium Carb-Cholecalciferol (CALCIUM 600 + D PO) Take 1 tablet by mouth 2 (two) times daily.    . cholecalciferol (VITAMIN D) 1000 UNITS tablet Take 1,000 Units by mouth daily.    . clobetasol ointment (TEMOVATE) [id number] % Apply 1 application topically 2 (two) times daily.    Marland Kitchen docusate sodium 100 MG CAPS Take 100 mg by mouth 2 (two) times daily. 10 capsule 0  . fish oil-omega-3 fatty acids 1000 MG capsule Take 2 g by mouth daily.    . folic acid (FOLVITE) 1 MG tablet Take 1 mg by mouth daily.    Marland Kitchen glipiZIDE (GLUCOTROL) 10 MG tablet Take 10 mg by mouth 2 (two) times daily before a meal.    . HYDROcodone-acetaminophen (NORCO) 7.5-325 MG per tablet Take 1-2 tablets by mouth every 4 (four) hours as needed for moderate pain. 100 tablet 0  . insulin detemir (LEVEMIR) 100 UNIT/ML injection Inject 10 Units into the skin at bedtime.    . Liraglutide (VICTOZA) 18 MG/3ML SOPN Inject 1.8 mg into the skin every morning.    Marland Kitchen losartan (COZAAR) 25 MG tablet Take 12.5 mg by mouth every morning. Takes 1/2 tablet every am    . metFORMIN (GLUCOPHAGE) 1000 MG tablet Take 1,000 mg by mouth 2 (two) times daily with a meal.    . metoprolol tartrate (LOPRESSOR) 25 MG tablet Take 12.5 mg by mouth 2 (two) times daily.     .  nitroGLYCERIN (NITROSTAT) 0.4 MG SL tablet Place 0.4 mg under the tongue every 5 (five) minutes as needed for chest pain.    . polyethylene glycol (MIRALAX / GLYCOLAX) packet Take 17 g by mouth 2 (two) times daily. 14 each 0  . tacrolimus (PROGRAF) 1 MG capsule Take 1 mg by mouth See admin instructions. Pt was using for lichen planus of gums -  No longer has the lichen planus - but has the medication to use again if needed     No current facility-administered medications for this visit.     Past Medical History  Diagnosis Date  . Diabetes mellitus ([place])   . Hypertension   . Hypercholesteremia   . CAD (coronary artery disease)     a. s/p CABG 02/2003:  L-Dx, L radial-OM1, S-OM2/dCFX, S-RCA;  b.  Myoview 12/09: no ischemia, EF 59%  . Carotid stenosis     a. dopplers 0000000:  RICA [id number], LICA [id number]  . Gout   . Arthritis     RA - MOSTLY IN HANDS; OA AND PAIN BOTH KNEES - RIGHT KNEE PAIN WORSE  . Complication of anesthesia     PT TOLD THAT DURING HIS HEART SURGERY - BY DR. Servando Snare - HE HAD PROFUSE SWEATING AND  AWARENESS DURING THE SURGERY AND REMEMBERS  FEELING OF HEAVINESS IN CHEST  . Cancer (HCC)     SKIN CANCER INSIDE OF LEFT EAR REMOVED  . Hx of lichen planus     RIGHT LOWER LEG AND GUMS - TREATED - NO PROBLEMS AT PRESENT TIME 02/20/14  . Hernia, umbilical     PT STATES HE HAS UMBILICAL HERNIA - NO C/O OF PAIN     Past Surgical History  Procedure Laterality Date  . Bypass graft  2004    x5  . Back surgery    . Knee surgery    . Toe surgery      gout  . Rotator cuff repair      left  . Coronary artery bypass graft  2004  . Total knee arthroplasty Right 02/27/2014    Procedure: RIGHT TOTAL KNEE ARTHROPLASTY;  Surgeon: Mauri Pole, MD;  Location: [facility];  Service: Orthopedics;  Laterality: Right;    Social History   Social History  . Marital Status: Divorced    Spouse Name: N/A  . Number of Children: N/A  . Years of Education: N/A   Occupational History  . Not on  file.   Social History Main Topics  . Smoking status: Former Smoker    Types: Cigarettes    Start date: 04/11/1982  . Smokeless tobacco: Never Used  . Alcohol Use: No     Comment: QUIT SMOKING AROUND 2004  . Drug Use: No  . Sexual Activity: Not on file   Other Topics Concern  . Not on file   Social History Narrative    Family History  Problem Relation Age of Onset  . Heart disease Mother   . Diabetes Mother   . Heart failure Mother   . Addison's disease Sister   . AAA (abdominal aortic aneurysm) Sister   . Heart disease Sister     Psychologist, forensic  . Cancer Father     colon  . Heart attack Maternal Grandfather     ROS: URI with productive cough but no fevers or chills, hemoptysis, dysphasia, odynophagia, melena, hematochezia, dysuria, hematuria, rash, seizure activity, orthopnea, PND, pedal edema, claudication. Remaining systems are negative.  Physical Exam: Well-developed well-nourished in no acute distress.  Skin is warm and dry.  HEENT is normal.  Neck is supple.  Chest is clear to auscultation with normal expansion.  Cardiovascular exam is regular rate and rhythm.  Abdominal exam nontender or distended. No masses palpated. Extremities show no edema. neuro grossly intact  ECG Sinus rhythm at a rate of 88. Left axis deviation. Anterior infarct.

## 2015-10-24 NOTE — Assessment & Plan Note (Addendum)
Continue aspirin. Intolerant to statins. He is complaining of some increased dyspnea. Schedule echocardiogram to reassess LV function and nuclear study for risk stratification.

## 2015-10-24 NOTE — Patient Instructions (Signed)
Medication Instructions:   NO CHANGE  Labwork:  PLEASE BRING A COPY OF LABS FROM [place] WHEN DRAWN  Testing/Procedures:  Your physician has requested that you have a lexiscan myoview. For further information please visit [email or web address]. Please follow instruction sheet, as given.   Your physician has requested that you have an echocardiogram. Echocardiography is a painless test that uses sound waves to create images of your heart. It provides your doctor with information about the size and shape of your heart and how well your heart's chambers and valves are working. This procedure takes approximately one hour. There are no restrictions for this procedure.    Follow-Up:  Your physician wants you to follow-up in: [place] will receive a reminder letter in the mail two months in advance. If you don't receive a letter, please call our office to schedule the follow-up appointment.   If you need a refill on your cardiac medications before your next appointment, please call your pharmacy.

## 2015-10-24 NOTE — Assessment & Plan Note (Signed)
Continue aspirin. Intolerant to statins. Carotids followed at the [place].

## 2015-10-24 NOTE — Assessment & Plan Note (Addendum)
Intolerant to statins. Continue diet. He is scheduled to have blood work performed at the [place] in the near future. We will ask for his lipids. If LDL elevated we will try Zetia 10 mg daily.

## 2015-10-24 NOTE — Assessment & Plan Note (Signed)
Blood pressure controlled. Continue present medications. 

## 2015-10-25 ENCOUNTER — Telehealth: Payer: Self-pay | Admitting: Cardiology

## 2015-10-25 NOTE — Telephone Encounter (Signed)
Called and LVM regarding scheduling echo and myocardial perfusion study.

## 2016-05-26 DIAGNOSIS — R079 Chest pain, unspecified: Secondary | ICD-10-CM | POA: Insufficient documentation

## 2016-05-26 DIAGNOSIS — Z951 Presence of aortocoronary bypass graft: Secondary | ICD-10-CM | POA: Insufficient documentation

## 2018-06-21 ENCOUNTER — Other Ambulatory Visit: Payer: Self-pay | Admitting: General Surgery

## 2018-06-28 NOTE — Pre-Procedure Instructions (Addendum)
Francisco Howe  06/28/2018      [id number] [address] [place] [address] Phone: [phone] Fax: [phone]  [place], [place] [address] Phone: [phone] Fax: [phone]    Your procedure is scheduled on Dec. 9  Report to [facility] Admitting at 9:30  A.M.  Call this number if you have problems the morning of surgery:  [phone]   Remember:  Do not eat after midnight.   You Edick drink clear liquids until 8:30 A.M..  Clear liquids allowed are:                    Water, Juice (non-citric and without pulp), Carbonated beverages, Clear Tea, Black Coffee only, Plain Jell-O only, Gatorade and Plain Popsicles only                    DRINK ENSURE BY 8:30 A.M.    Take these medicines the morning of surgery with A SIP OF WATER :              Allopurinol (zyloprim)             Amlodipine (norvasc)             Ear drops            Metoprolol (lopressor)            Nitroglycerine if needed            7 days prior to surgery STOP taking any Aspirin(unless otherwise instructed by your surgeon), Aleve, Naproxen, Ibuprofen, Motrin, Advil, Goody's, BC's, all herbal medications, fish oil, and all vitamins                      How to Manage Your Diabetes Before and After Surgery  Why is it important to control my blood sugar before and after surgery? . Improving blood sugar levels before and after surgery helps healing and can limit problems. . A way of improving blood sugar control is eating a healthy diet by: o  Eating less sugar and carbohydrates o  Increasing activity/exercise o  Talking with your doctor about reaching your blood sugar goals . High blood sugars (greater than 180 mg/dL) can raise your risk of infections and slow your recovery, so you will need to focus on controlling your diabetes during the weeks before  surgery. . Make sure that the doctor who takes care of your diabetes knows about your planned surgery including the date and location.  How do I manage my blood sugar before surgery? . Check your blood sugar at least 4 times a day, starting 2 days before surgery, to make sure that the level is not too high or low. o Check your blood sugar the morning of your surgery when you wake up and every 2 hours until you get to the Short Stay unit. . If your blood sugar is less than 70 mg/dL, you will need to treat for low blood sugar: o Do not take insulin. o Treat a low blood sugar (less than 70 mg/dL) with  cup of clear juice (cranberry or apple), 4 glucose tablets, OR glucose gel. Recheck blood sugar in 15 minutes after treatment (to make sure it is greater than 70 mg/dL). If your blood sugar is not greater than 70 mg/dL on recheck, call  [phone] o  for further instructions. . Report your blood sugar to the short stay nurse when you get to Short Stay.  . If you are admitted to the hospital after surgery: o Your blood sugar will be checked by the staff and you will probably be given insulin after surgery (instead of oral diabetes medicines) to make sure you have good blood sugar levels. o The goal for blood sugar control after surgery is 80-180 mg/dL.       WHAT DO I DO ABOUT MY DIABETES MEDICATION?   Marland Kitchen Do not take oral diabetes medicines (pills) the morning of surgery.       . THE MORNING OF SURGERY, take ________40_____ units of _____lantus_____insulin.    . The day of surgery, do not take other diabetes injectables, including Byetta (exenatide), Bydureon (exenatide ER), Victoza (liraglutide), or Trulicity (dulaglutide).                 Do not wear jewelry.  Do not wear lotions, powders, or perfumes, or deodorant.  Do not shave 48 hours prior to surgery.  Men [place] shave face and neck.  Do not bring valuables to the hospital.  [facility] is not responsible for any belongings or  valuables.  Contacts, dentures or bridgework [place] not be worn into surgery.  Leave your suitcase in the car.  After surgery it [place] be brought to your room.  For patients admitted to the hospital, discharge time will be determined by your treatment team.  Patients discharged the day of surgery will not be allowed to drive home.    Special instructions:   [facility]- Preparing For Surgery  Before surgery, you can play an important role. Because skin is not sterile, your skin needs to be as free of germs as possible. You can reduce the number of germs on your skin by washing with CHG (chlorahexidine gluconate) Soap before surgery.  CHG is an antiseptic cleaner which kills germs and bonds with the skin to continue killing germs even after washing.    Oral Hygiene is also important to reduce your risk of infection.  Remember - BRUSH YOUR TEETH THE MORNING OF SURGERY WITH YOUR REGULAR TOOTHPASTE  Please do not use if you have an allergy to CHG or antibacterial soaps. If your skin becomes reddened/irritated stop using the CHG.  Do not shave (including legs and underarms) for at least 48 hours prior to first CHG shower. It is OK to shave your face.  Please follow these instructions carefully.   1. Shower the NIGHT BEFORE SURGERY and the MORNING OF SURGERY with CHG.   2. If you chose to wash your hair, wash your hair first as usual with your normal shampoo.  3. After you shampoo, rinse your hair and body thoroughly to remove the shampoo.  4. Use CHG as you would any other liquid soap. You can apply CHG directly to the skin and wash gently with a scrungie or a clean washcloth.   5. Apply the CHG Soap to your body ONLY FROM THE NECK DOWN.  Do not use on open wounds or open sores. Avoid contact with your eyes, ears, mouth and genitals (private parts). Wash Face and genitals (private parts)  with your normal soap.  6. Wash thoroughly, paying special attention to the area where your surgery will be  performed.  7. Thoroughly rinse your body with warm water from the neck down.  8. DO NOT shower/wash with your normal soap after using and rinsing  off the CHG Soap.  9. Pat yourself dry with a CLEAN TOWEL.  10. Wear CLEAN PAJAMAS to bed the night before surgery, wear comfortable clothes the morning of surgery  11. Place CLEAN SHEETS on your bed the night of your first shower and DO NOT SLEEP WITH PETS.    Day of Surgery:  Do not apply any deodorants/lotions.  Please wear clean clothes to the hospital/surgery center.   Remember to brush your teeth WITH YOUR REGULAR TOOTHPASTE.    Please read over the following fact sheets that you were given. Coughing and Deep Breathing and Surgical Site Infection Prevention

## 2018-06-29 ENCOUNTER — Encounter (HOSPITAL_COMMUNITY)
Admission: RE | Admit: 2018-06-29 | Discharge: 2018-06-29 | Disposition: A | Discharge status: Home / Self Care | Payer: Medicare Other | Source: Ambulatory Visit | Attending: General Surgery | Admitting: General Surgery

## 2018-06-29 ENCOUNTER — Other Ambulatory Visit: Payer: Self-pay

## 2018-06-29 ENCOUNTER — Encounter (HOSPITAL_COMMUNITY): Payer: Self-pay

## 2018-06-29 DIAGNOSIS — Z01818 Encounter for other preprocedural examination: Secondary | ICD-10-CM | POA: Diagnosis present

## 2018-06-29 HISTORY — DX: Acute myocardial infarction, unspecified: I21.9

## 2018-06-29 HISTORY — DX: Dyspnea, unspecified: R06.00

## 2018-06-29 LAB — CBC
HCT: 47.1 % (ref 39.0–52.0)
Hemoglobin: 15.2 g/dL (ref 13.0–17.0)
MCH: 29.5 pg (ref 26.0–34.0)
MCHC: 32.3 g/dL (ref 30.0–36.0)
MCV: 91.3 fL (ref 80.0–100.0)
Platelets: 264 10*3/uL (ref 150–400)
RBC: 5.16 MIL/uL (ref 4.22–5.81)
RDW: 13 % (ref 11.5–15.5)
WBC: 6.7 10*3/uL (ref 4.0–10.5)
nRBC: 0 % (ref 0.0–0.2)

## 2018-06-29 LAB — HEMOGLOBIN A1C
Hgb A1c MFr Bld: 7.8 % — ABNORMAL HIGH (ref 4.8–5.6)
Mean Plasma Glucose: 177.16 mg/dL

## 2018-06-29 LAB — BASIC METABOLIC PANEL
Anion gap: 15 (ref 5–15)
BUN: 11 mg/dL (ref 8–23)
CO2: 21 mmol/L — AB (ref 22–32)
Calcium: 10 mg/dL (ref 8.9–10.3)
Chloride: 96 mmol/L — ABNORMAL LOW (ref 98–111)
Creatinine, Ser: 0.81 mg/dL (ref 0.61–1.24)
GFR calc Af Amer: >60 mL/min (ref >60)
GFR calc non Af Amer: >60 mL/min (ref >60)
Glucose, Bld: 162 mg/dL — ABNORMAL HIGH (ref 70–99)
Potassium: 3.8 mmol/L (ref 3.5–5.1)
Sodium: 132 mmol/L — ABNORMAL LOW (ref 135–145)

## 2018-06-29 LAB — GLUCOSE, CAPILLARY: Glucose-Capillary: 169 mg/dL — ABNORMAL HIGH (ref 70–99)

## 2018-06-29 NOTE — Progress Notes (Signed)
PCP: [facility], Gerome Sam  DM: type 2, Fasting sugars 150's  SA: denies  Pt denies SOB, cough, fever, chest pain  Pt stated understanding of instructions given for DOS.

## 2018-06-30 NOTE — Anesthesia Preprocedure Evaluation (Deleted)
Anesthesia Evaluation    Airway        Dental   Pulmonary former smoker,           Cardiovascular hypertension,      Neuro/Psych    GI/Hepatic   Endo/Other  diabetes  Renal/GU      Musculoskeletal   Abdominal   Peds  Hematology   Anesthesia Other Findings   Reproductive/Obstetrics                                                              Anesthesia Evaluation  Patient identified by MRN, date of birth, ID band Patient awake    Reviewed: Allergy & Precautions, H&P , NPO status , Patient's Chart, lab work & pertinent test results, reviewed documented beta blocker date and time   Airway Mallampati: II TM Distance: >3 FB Neck ROM: full    Dental no notable dental hx. (+) Caps, Dental Advisory Given 2 front upper are caps:   Pulmonary neg pulmonary ROS, former smoker,  breath sounds clear to auscultation  Pulmonary exam normal       Cardiovascular Exercise Tolerance: Good hypertension, Pt. on medications and Pt. on home beta blockers + CAD and + CABG Rhythm:regular Rate:Normal     Neuro/Psych Carotid stenosis. Back surgery negative neurological ROS  negative psych ROS   GI/Hepatic negative GI ROS, Neg liver ROS,   Endo/Other  diabetes, Well Controlled, Type 2, Oral Hypoglycemic Agents  Renal/GU negative Renal ROS  negative genitourinary   Musculoskeletal   Abdominal   Peds  Hematology negative hematology ROS (+)   Anesthesia Other Findings   Reproductive/Obstetrics negative OB ROS                           Anesthesia Physical Anesthesia Plan  ASA: III  Anesthesia Plan: Spinal   Post-op Pain Management:    Induction:   Airway Management Planned: Simple Face Mask  Additional Equipment:   Intra-op Plan:   Post-operative Plan:   Informed Consent: I have reviewed the patients History and Physical, chart, labs and  discussed the procedure including the risks, benefits and alternatives for the proposed anesthesia with the patient or authorized representative who has indicated his/her understanding and acceptance.   Dental Advisory Given  Plan Discussed with: CRNA and Surgeon  Anesthesia Plan Comments:         Anesthesia Quick Evaluation  Anesthesia Physical Anesthesia Plan  ASA:   Anesthesia Plan:    Post-op Pain Management:    Induction:   PONV Risk Score and Plan:   Airway Management Planned:   Additional Equipment:   Intra-op Plan:   Post-operative Plan:   Informed Consent:   Plan Discussed with:   Anesthesia Plan Comments: (See PAT note 06/29/2018 by Karoline Caldwell, PA-C )        Anesthesia Quick Evaluation

## 2018-06-30 NOTE — Progress Notes (Signed)
Anesthesia Chart Review:  Case:  [id number] Date/Time:  07/04/18 1115   Procedures:      POSSIBLE LAPAROSCOPY DIAGNOSTIC (N/A )     UMBILCAL HERNIA REPAIR WITH MESH (N/A )   Anesthesia type:  General   Pre-op diagnosis:  UMBILICAL HERNIA   Location:  [facility] OR ROOM 12 / [facility]   Surgeon:  Rolm Bookbinder, MD      DISCUSSION: 72 yo male former smoker. Pertinent hx includes IDDMII, HTN, HLD, CAD (s/p MI and CABG 2004), DOE.  Pt previously followed with Dr. Stanford Breed, now follows with cardiologyt at [facility]. Dobutamine stress echo 12/2015 was negative for ischemia. Showed normal LV systolic function, EF 02-54%, no regional wall motion abnormalities, grade 1 dd, mild LVH, normal RV size and function, no significant valvular stenosis or regurgitation.  Cardiac clearance by Filbert Berthold, PA-C 05/27/2018 on pt chart states: "He is under the care of the cardiology clinic at the [facility] for history of CAD with previous bypass surgery in 2004.  Using the revised cardiac risk index for preoperative risk calculator he has 3 points: High risk (intraperitoneal surgery), history of ischemic heart disease, and preoperative insulin use.  This correlates to a class IV or 15% 30-day risk of death, MI, or cardiac arrest.  This is considered relatively high risk.  However, he is considered stable to proceed from a cardiac standpoint.  On his last visit he was symptom-free, and we recommended follow-up in 12 months.  He requires no further preoperative cardiac testing."  Anticipate he can proceed as planned barring acute status change.   VS: BP 121/66   Pulse 85   Temp 36.7 C   Resp 18   Ht 6' (1.829 m)   Wt 100.5 kg   SpO2 95%   BMI 30.04 kg/m   PROVIDERS: Gerome Sam, MD is PCP  Margaretha Seeds is Cardiology provider  LABS: Labs reviewed: Acceptable for surgery. (all labs ordered are listed, but only abnormal results are displayed)  Labs Reviewed   GLUCOSE, CAPILLARY - Abnormal; Notable for the following components:      Result Value   Glucose-Capillary 169 (*)    All other components within normal limits  HEMOGLOBIN A1C - Abnormal; Notable for the following components:   Hgb A1c MFr Bld 7.8 (*)    All other components within normal limits  BASIC METABOLIC PANEL - Abnormal; Notable for the following components:   Sodium 132 (*)    Chloride 96 (*)    CO2 21 (*)    Glucose, Bld 162 (*)    All other components within normal limits  CBC     EKG: 06/29/2018: Normal sinus rhythm. Rate 79. Left axis deviation. Right bundle branch block. Inferior infarct , age undetermined  03/07/2018 (outside record, copy on pt chart): NSR with first-degree AV block.  Rate 90.  RBBB.  LAFB.  Possible lateral infarct (cited on or before March 04, 2017).  Cannot rule out inferior infarct (cited on or before March 04, 2017).  When compared with previous ECG, nonspecific T wave abnormality has replaced inverted T waves in inferior leads.  CV: Dobutamine stress echo 12/27/2015 (echo results on pt chart, dobutamine stress results in Epic):  Overall impression:  Negative study for ischemia by EKG criteria. See echo results.   Echo Summary: 1.  The left ventricle is normal in size.  Left ventricular systolic function is normal.  Ejection fraction = 55 - 60%.  No regional wall  motion abnormalities noted.  Grade 1 diastolic dysfunction.  There is mild concentric left ventricular hypertrophy.  2.  The right ventricle is normal size and function.  3.  No significant valvular stenosis or regurgitation.  4.  Right ventricular systolic pressure is normal.  Right ventricular systolic pressure is estimated at 27 to 32 mmHg.  Past Medical History:  Diagnosis Date  . Arthritis    RA - MOSTLY IN HANDS; OA AND PAIN BOTH KNEES - RIGHT KNEE PAIN WORSE  . CAD (coronary artery disease)    a. s/p CABG 02/2003:  L-Dx, L radial-OM1, S-OM2/dCFX, S-RCA;  b.  Myoview 12/09:  no ischemia, EF 59%  . Cancer (HCC)    SKIN CANCER INSIDE OF LEFT EAR REMOVED  . Carotid stenosis    a. dopplers 6/80:  RICA 3-21%, LICA 22-48%  . Complication of anesthesia    PT TOLD THAT DURING HIS HEART SURGERY - BY DR. Servando Snare - HE HAD PROFUSE SWEATING AND  AWARENESS DURING THE SURGERY AND REMEMBERS FEELING OF HEAVINESS IN CHEST  . Diabetes mellitus ([place])   . Dyspnea    upon exertion  . Gout   . Hernia, umbilical    PT STATES HE HAS UMBILICAL HERNIA - NO C/O OF PAIN   . Hx of lichen planus    RIGHT LOWER LEG AND GUMS - TREATED - NO PROBLEMS AT PRESENT TIME 02/20/14  . Hypercholesteremia   . Hypertension   . Myocardial infarction [facility])     Past Surgical History:  Procedure Laterality Date  . BACK SURGERY    . BYPASS GRAFT  2004   x5  . CORONARY ARTERY BYPASS GRAFT  2004  . KNEE SURGERY    . ROTATOR CUFF REPAIR     left  . TOE SURGERY     gout  . TOTAL KNEE ARTHROPLASTY Right 02/27/2014   Procedure: RIGHT TOTAL KNEE ARTHROPLASTY;  Surgeon: Mauri Pole, MD;  Location: [facility] ORS;  Service: Orthopedics;  Laterality: Right;    MEDICATIONS: . allopurinol (ZYLOPRIM) 300 MG tablet  . amLODipine (NORVASC) 10 MG tablet  . Calcium-Magnesium-Vitamin D (CALCIUM 1200+D3 PO)  . cholecalciferol (VITAMIN D) 1000 UNITS tablet  . ciprofloxacin-dexamethasone (CIPRODEX) OTIC suspension  . Cyanocobalamin (VITAMIN B-12) 5000 MCG TBDP  . docusate sodium 100 MG CAPS  . HYDROcodone-acetaminophen (NORCO) 7.5-325 MG per tablet  . insulin glargine (LANTUS) 100 UNIT/ML injection  . losartan (COZAAR) 100 MG tablet  . metFORMIN (GLUCOPHAGE) 1000 MG tablet  . metoprolol tartrate (LOPRESSOR) 25 MG tablet  . nitroGLYCERIN (NITROSTAT) 0.4 MG SL tablet  . Omega-3 Fatty Acids (FISH OIL) 1200 MG CAPS  . polyethylene glycol (MIRALAX / GLYCOLAX) packet  . Semaglutide, 1 MG/DOSE, (OZEMPIC, 1 MG/DOSE,) 2 MG/1.5ML SOPN  . tacrolimus (PROGRAF) 1 MG capsule   No current facility-administered medications  for this encounter.      Wynonia Musty [facility] Short Stay Center/Anesthesiology Phone [phone] 06/30/2018 10:23 AM

## 2018-07-04 ENCOUNTER — Encounter (HOSPITAL_COMMUNITY)
Admission: RE | Disposition: A | Discharge status: Home / Self Care | Payer: Self-pay | Source: Ambulatory Visit | Attending: General Surgery

## 2018-07-04 ENCOUNTER — Ambulatory Visit (HOSPITAL_COMMUNITY): Payer: Medicare Other | Admitting: Certified Registered Nurse Anesthetist

## 2018-07-04 ENCOUNTER — Ambulatory Visit (HOSPITAL_COMMUNITY): Payer: Medicare Other | Admitting: Physician Assistant

## 2018-07-04 ENCOUNTER — Encounter (HOSPITAL_COMMUNITY): Payer: Self-pay | Admitting: *Deleted

## 2018-07-04 ENCOUNTER — Other Ambulatory Visit: Payer: Self-pay

## 2018-07-04 ENCOUNTER — Ambulatory Visit (HOSPITAL_COMMUNITY)
Admission: RE | Admit: 2018-07-04 | Discharge: 2018-07-04 | Disposition: A | Discharge status: Home / Self Care | Payer: Medicare Other | Source: Ambulatory Visit | Attending: General Surgery | Admitting: General Surgery

## 2018-07-04 DIAGNOSIS — K429 Umbilical hernia without obstruction or gangrene: Secondary | ICD-10-CM | POA: Insufficient documentation

## 2018-07-04 DIAGNOSIS — E7800 Pure hypercholesterolemia, unspecified: Secondary | ICD-10-CM | POA: Diagnosis not present

## 2018-07-04 DIAGNOSIS — Z79899 Other long term (current) drug therapy: Secondary | ICD-10-CM | POA: Insufficient documentation

## 2018-07-04 DIAGNOSIS — I509 Heart failure, unspecified: Secondary | ICD-10-CM | POA: Diagnosis not present

## 2018-07-04 DIAGNOSIS — Z951 Presence of aortocoronary bypass graft: Secondary | ICD-10-CM | POA: Diagnosis not present

## 2018-07-04 DIAGNOSIS — I2510 Atherosclerotic heart disease of native coronary artery without angina pectoris: Secondary | ICD-10-CM | POA: Diagnosis not present

## 2018-07-04 DIAGNOSIS — E119 Type 2 diabetes mellitus without complications: Secondary | ICD-10-CM | POA: Insufficient documentation

## 2018-07-04 DIAGNOSIS — Z87891 Personal history of nicotine dependence: Secondary | ICD-10-CM | POA: Diagnosis not present

## 2018-07-04 DIAGNOSIS — I252 Old myocardial infarction: Secondary | ICD-10-CM | POA: Diagnosis not present

## 2018-07-04 DIAGNOSIS — Z794 Long term (current) use of insulin: Secondary | ICD-10-CM | POA: Diagnosis not present

## 2018-07-04 DIAGNOSIS — N400 Enlarged prostate without lower urinary tract symptoms: Secondary | ICD-10-CM | POA: Insufficient documentation

## 2018-07-04 DIAGNOSIS — I110 Hypertensive heart disease with heart failure: Secondary | ICD-10-CM | POA: Insufficient documentation

## 2018-07-04 HISTORY — PX: UMBILICAL HERNIA REPAIR: SHX196

## 2018-07-04 LAB — GLUCOSE, CAPILLARY
Glucose-Capillary: 326 mg/dL — ABNORMAL HIGH (ref 70–99)
Glucose-Capillary: 208 mg/dL — ABNORMAL HIGH (ref 70–99)
Glucose-Capillary: 168 mg/dL — ABNORMAL HIGH (ref 70–99)
Glucose-Capillary: 191 mg/dL — ABNORMAL HIGH (ref 70–99)

## 2018-07-04 SURGERY — REPAIR, HERNIA, UMBILICAL, ADULT
Anesthesia: Spinal

## 2018-07-04 MED ORDER — FENTANYL CITRATE (PF) 250 MCG/5ML IJ SOLN
INTRAMUSCULAR | Status: AC
  Filled 2018-07-04: qty 5

## 2018-07-04 MED ORDER — CEFAZOLIN SODIUM-DEXTROSE 2-4 GM/100ML-% IV SOLN
INTRAVENOUS | Status: AC
  Filled 2018-07-04: qty 100

## 2018-07-04 MED ORDER — MIDAZOLAM HCL 2 MG/2ML IJ SOLN
INTRAMUSCULAR | Status: AC
  Filled 2018-07-04: qty 2

## 2018-07-04 MED ORDER — LIDOCAINE 2% (20 MG/ML) 5 ML SYRINGE
INTRAMUSCULAR | Status: AC
  Filled 2018-07-04: qty 10

## 2018-07-04 MED ORDER — ACETAMINOPHEN 500 MG PO TABS
ORAL_TABLET | ORAL | Status: AC
  Administered 2018-07-04: 1000 mg via ORAL
  Filled 2018-07-04: qty 2

## 2018-07-04 MED ORDER — ACETAMINOPHEN 500 MG PO TABS: 1000.0000 mg | ORAL_TABLET | Freq: Four times a day (QID) | ORAL | Status: DC

## 2018-07-04 MED ORDER — ONDANSETRON HCL 4 MG/2ML IJ SOLN
INTRAMUSCULAR | Status: AC
  Filled 2018-07-04: qty 2

## 2018-07-04 MED ORDER — ACETAMINOPHEN 325 MG PO TABS: 650.0000 mg | ORAL_TABLET | ORAL | Status: DC | PRN

## 2018-07-04 MED ORDER — ONDANSETRON HCL 4 MG/2ML IJ SOLN: 4.0000 mg | Freq: Once | INTRAMUSCULAR | Status: DC | PRN

## 2018-07-04 MED ORDER — ACETAMINOPHEN 500 MG PO TABS
1000.0000 mg | ORAL_TABLET | ORAL | Status: AC
  Administered 2018-07-04: 1000 mg via ORAL

## 2018-07-04 MED ORDER — LIDOCAINE 2% (20 MG/ML) 5 ML SYRINGE
INTRAMUSCULAR | Status: DC | PRN
  Administered 2018-07-04: 100 mg via INTRAVENOUS

## 2018-07-04 MED ORDER — BUPIVACAINE-EPINEPHRINE (PF) 0.25% -1:200000 IJ SOLN
INTRAMUSCULAR | Status: AC
  Filled 2018-07-04: qty 30

## 2018-07-04 MED ORDER — PROPOFOL 10 MG/ML IV BOLUS
INTRAVENOUS | Status: DC | PRN
  Administered 2018-07-04: 100 mg via INTRAVENOUS

## 2018-07-04 MED ORDER — FENTANYL CITRATE (PF) 100 MCG/2ML IJ SOLN
INTRAMUSCULAR | Status: AC
  Administered 2018-07-04: 50 ug
  Filled 2018-07-04: qty 2

## 2018-07-04 MED ORDER — DEXAMETHASONE SODIUM PHOSPHATE 10 MG/ML IJ SOLN
INTRAMUSCULAR | Status: AC
  Filled 2018-07-04: qty 1

## 2018-07-04 MED ORDER — GABAPENTIN 100 MG PO CAPS
ORAL_CAPSULE | ORAL | Status: AC
  Administered 2018-07-04: 100 mg via ORAL
  Filled 2018-07-04: qty 1

## 2018-07-04 MED ORDER — EPHEDRINE SULFATE-NACL 50-0.9 MG/10ML-% IV SOSY
PREFILLED_SYRINGE | INTRAVENOUS | Status: DC | PRN
  Administered 2018-07-04: 10 mg via INTRAVENOUS

## 2018-07-04 MED ORDER — OXYCODONE HCL 5 MG PO TABS: 5.0000 mg | ORAL_TABLET | Freq: Once | ORAL | Status: DC | PRN

## 2018-07-04 MED ORDER — LACTATED RINGERS IV SOLN
INTRAVENOUS | Status: DC
  Administered 2018-07-04 (×2): via INTRAVENOUS

## 2018-07-04 MED ORDER — ACETAMINOPHEN 650 MG RE SUPP: 650.0000 mg | RECTAL | Status: DC | PRN

## 2018-07-04 MED ORDER — MIDAZOLAM HCL 2 MG/2ML IJ SOLN
INTRAMUSCULAR | Status: AC
  Administered 2018-07-04: 2 mg
  Filled 2018-07-04: qty 2

## 2018-07-04 MED ORDER — ENSURE PRE-SURGERY PO LIQD: 296.0000 mL | Freq: Once | ORAL | Status: DC

## 2018-07-04 MED ORDER — MEPERIDINE HCL 50 MG/ML IJ SOLN: 6.2500 mg | INTRAMUSCULAR | Status: DC | PRN

## 2018-07-04 MED ORDER — GABAPENTIN 100 MG PO CAPS
100.0000 mg | ORAL_CAPSULE | ORAL | Status: AC
  Administered 2018-07-04: 100 mg via ORAL

## 2018-07-04 MED ORDER — INSULIN ASPART 100 UNIT/ML ~~LOC~~ SOLN
10.0000 [IU] | Freq: Once | SUBCUTANEOUS | Status: AC
  Administered 2018-07-04: 10 [IU] via SUBCUTANEOUS

## 2018-07-04 MED ORDER — ACETAMINOPHEN 160 MG/5ML PO SOLN: 325.0000 mg | ORAL | Status: DC | PRN

## 2018-07-04 MED ORDER — FENTANYL CITRATE (PF) 100 MCG/2ML IJ SOLN
INTRAMUSCULAR | Status: DC | PRN
  Administered 2018-07-04: 50 ug via INTRAVENOUS

## 2018-07-04 MED ORDER — MORPHINE SULFATE (PF) 2 MG/ML IV SOLN: 2.0000 mg | INTRAVENOUS | Status: DC | PRN

## 2018-07-04 MED ORDER — SUGAMMADEX SODIUM 200 MG/2ML IV SOLN
INTRAVENOUS | Status: DC | PRN
  Administered 2018-07-04: 400 mg via INTRAVENOUS

## 2018-07-04 MED ORDER — ONDANSETRON HCL 4 MG/2ML IJ SOLN
INTRAMUSCULAR | Status: DC | PRN
  Administered 2018-07-04: 4 mg via INTRAVENOUS

## 2018-07-04 MED ORDER — GABAPENTIN 300 MG PO CAPS
ORAL_CAPSULE | ORAL | Status: AC
  Filled 2018-07-04: qty 1

## 2018-07-04 MED ORDER — ACETAMINOPHEN 325 MG PO TABS: 325.0000 mg | ORAL_TABLET | ORAL | Status: DC | PRN

## 2018-07-04 MED ORDER — OXYCODONE HCL 5 MG PO TABS: 5.0000 mg | ORAL_TABLET | Freq: Four times a day (QID) | ORAL | 0 refills | Status: DC | PRN

## 2018-07-04 MED ORDER — 0.9 % SODIUM CHLORIDE (POUR BTL) OPTIME
TOPICAL | Status: DC | PRN
  Administered 2018-07-04: 1000 mL

## 2018-07-04 MED ORDER — PROPOFOL 10 MG/ML IV BOLUS
INTRAVENOUS | Status: AC
  Filled 2018-07-04: qty 20

## 2018-07-04 MED ORDER — ROCURONIUM BROMIDE 10 MG/ML (PF) SYRINGE
PREFILLED_SYRINGE | INTRAVENOUS | Status: DC | PRN
  Administered 2018-07-04: 50 mg via INTRAVENOUS
  Administered 2018-07-04: 20 mg via INTRAVENOUS

## 2018-07-04 MED ORDER — PHENYLEPHRINE 40 MCG/ML (10ML) SYRINGE FOR IV PUSH (FOR BLOOD PRESSURE SUPPORT)
PREFILLED_SYRINGE | INTRAVENOUS | Status: DC | PRN
  Administered 2018-07-04: 120 ug via INTRAVENOUS

## 2018-07-04 MED ORDER — OXYCODONE HCL 5 MG/5ML PO SOLN: 5.0000 mg | Freq: Once | ORAL | Status: DC | PRN

## 2018-07-04 MED ORDER — CEFAZOLIN SODIUM-DEXTROSE 2-4 GM/100ML-% IV SOLN
2.0000 g | INTRAVENOUS | Status: AC
  Administered 2018-07-04: 2 g via INTRAVENOUS

## 2018-07-04 MED ORDER — OXYCODONE HCL 5 MG PO TABS: 5.0000 mg | ORAL_TABLET | ORAL | Status: DC | PRN

## 2018-07-04 MED ORDER — SODIUM CHLORIDE 0.9 % IV SOLN
INTRAVENOUS | Status: DC | PRN
  Administered 2018-07-04: 50 ug/min via INTRAVENOUS

## 2018-07-04 MED ORDER — FENTANYL CITRATE (PF) 100 MCG/2ML IJ SOLN: 25.0000 ug | INTRAMUSCULAR | Status: DC | PRN

## 2018-07-04 SURGICAL SUPPLY — 58 items
ADH SKN CLS APL DERMABOND .7 (GAUZE/BANDAGES/DRESSINGS) ×1
APPLIER CLIP 5 13 M/L LIGAMAX5 (MISCELLANEOUS)
APR CLP MED LRG 5 ANG JAW (MISCELLANEOUS)
BLADE CLIPPER SURG (BLADE) IMPLANT
BLADE SURG 15 STRL LF DISP TIS (BLADE) ×1 IMPLANT
BLADE SURG 15 STRL SS (BLADE) ×2
CANISTER SUCT 3000ML PPV (MISCELLANEOUS) IMPLANT
CHLORAPREP W/TINT 26ML (MISCELLANEOUS) ×2 IMPLANT
CLIP APPLIE 5 13 M/L LIGAMAX5 (MISCELLANEOUS) IMPLANT
CLSR STERI-STRIP ANTIMIC 1/2X4 (GAUZE/BANDAGES/DRESSINGS) ×1 IMPLANT
COVER SURGICAL LIGHT HANDLE (MISCELLANEOUS) ×2 IMPLANT
COVER WAND RF STERILE (DRAPES) ×1 IMPLANT
DECANTER SPIKE VIAL GLASS SM (MISCELLANEOUS) ×1 IMPLANT
DERMABOND ADVANCED (GAUZE/BANDAGES/DRESSINGS) ×1
DERMABOND ADVANCED .7 DNX12 (GAUZE/BANDAGES/DRESSINGS) ×1 IMPLANT
DRAPE LAPAROSCOPIC ABDOMINAL (DRAPES) ×1 IMPLANT
DRAPE UTILITY XL STRL (DRAPES) ×2 IMPLANT
ELECT CAUTERY BLADE 6.4 (BLADE) ×2 IMPLANT
ELECT REM PT RETURN 9FT ADLT (ELECTROSURGICAL) ×2
ELECTRODE REM PT RTRN 9FT ADLT (ELECTROSURGICAL) ×1 IMPLANT
GAUZE 4X4 16PLY RFD (DISPOSABLE) ×1 IMPLANT
GLOVE BIO SURGEON STRL SZ7 (GLOVE) ×2 IMPLANT
GLOVE BIOGEL PI IND STRL 7.5 (GLOVE) ×1 IMPLANT
GLOVE BIOGEL PI INDICATOR 7.5 (GLOVE) ×1
GOWN STRL REUS W/ TWL LRG LVL3 (GOWN DISPOSABLE) ×3 IMPLANT
GOWN STRL REUS W/TWL LRG LVL3 (GOWN DISPOSABLE) ×4
KIT BASIN OR (CUSTOM PROCEDURE TRAY) ×2 IMPLANT
KIT TURNOVER KIT B (KITS) ×2 IMPLANT
NDL HYPO 25GX1X1/2 BEV (NEEDLE) ×1 IMPLANT
NEEDLE HYPO 25GX1X1/2 BEV (NEEDLE) IMPLANT
NS IRRIG 1000ML POUR BTL (IV SOLUTION) ×2 IMPLANT
PACK SURGICAL SETUP 50X90 (CUSTOM PROCEDURE TRAY) ×2 IMPLANT
PAD ARMBOARD 7.5X6 YLW CONV (MISCELLANEOUS) ×2 IMPLANT
PENCIL BUTTON HOLSTER BLD 10FT (ELECTRODE) ×2 IMPLANT
SCISSORS LAP 5X35 DISP (ENDOMECHANICALS) IMPLANT
SET IRRIG TUBING LAPAROSCOPIC (IRRIGATION / IRRIGATOR) IMPLANT
SLEEVE ENDOPATH XCEL 5M (ENDOMECHANICALS) ×1 IMPLANT
STRIP CLOSURE SKIN 1/2X4 (GAUZE/BANDAGES/DRESSINGS) ×2 IMPLANT
SUT ETHIBOND 0 MO6 C/R (SUTURE) IMPLANT
SUT ETHIBOND NAB CT1 #1 30IN (SUTURE) ×3 IMPLANT
SUT MNCRL AB 4-0 PS2 18 (SUTURE) ×2 IMPLANT
SUT PROLENE 0 CT 1 CR/8 (SUTURE) IMPLANT
SUT VIC AB 2-0 CT1 27 (SUTURE)
SUT VIC AB 2-0 CT1 TAPERPNT 27 (SUTURE) ×1 IMPLANT
SUT VIC AB 3-0 SH 27 (SUTURE) ×2
SUT VIC AB 3-0 SH 27X BRD (SUTURE) ×1 IMPLANT
SUT VICRYL AB 2 0 TIES (SUTURE) ×2 IMPLANT
SYR CONTROL 10ML LL (SYRINGE) ×1 IMPLANT
TOWEL OR 17X24 6PK STRL BLUE (TOWEL DISPOSABLE) ×2 IMPLANT
TOWEL OR 17X26 10 PK STRL BLUE (TOWEL DISPOSABLE) ×2 IMPLANT
TRAY FOLEY MTR SLVR 14FR STAT (SET/KITS/TRAYS/PACK) IMPLANT
TRAY LAPAROSCOPIC MC (CUSTOM PROCEDURE TRAY) ×2 IMPLANT
TROCAR XCEL BLUNT TIP 100MML (ENDOMECHANICALS) IMPLANT
TROCAR XCEL NON-BLD 11X100MML (ENDOMECHANICALS) IMPLANT
TROCAR XCEL NON-BLD 5MMX100MML (ENDOMECHANICALS) ×1 IMPLANT
TUBE CONNECTING 12X1/4 (SUCTIONS) ×1 IMPLANT
TUBING INSUFFLATION (TUBING) ×1 IMPLANT
YANKAUER SUCT BULB TIP NO VENT (SUCTIONS) ×1 IMPLANT

## 2018-07-04 NOTE — Progress Notes (Signed)
Patient checked blood sugar this morning at 0730 with the result being 146 patient drank the pre-srugery ensure blood sugar upon arrival was 326.  Dr Ambrose Pancoast made aware and order for 10 units of insulin given.  Will continue to monitor blood sugar

## 2018-07-04 NOTE — Anesthesia Preprocedure Evaluation (Addendum)
Anesthesia Evaluation  Patient identified by MRN, date of birth, ID band Patient awake    Reviewed: Allergy & Precautions, H&P , NPO status , Patient's Chart, lab work & pertinent test results, reviewed documented beta blocker date and time   History of Anesthesia Complications (+) history of anesthetic complications  Airway Mallampati: II  TM Distance: >3 FB Neck ROM: full    Dental no notable dental hx. (+) Caps, Dental Advisory Given, Teeth Intact 2 front upper are caps:   Pulmonary neg pulmonary ROS, former smoker,    Pulmonary exam normal breath sounds clear to auscultation       Cardiovascular Exercise Tolerance: Good hypertension, Pt. on medications and Pt. on home beta blockers + CAD and + CABG  Normal cardiovascular exam Rhythm:regular Rate:Normal     Neuro/Psych Carotid stenosis. Back surgery negative neurological ROS  negative psych ROS   GI/Hepatic negative GI ROS, Neg liver ROS,   Endo/Other  diabetes, Well Controlled, Type 2, Oral Hypoglycemic Agents  Renal/GU negative Renal ROS  negative genitourinary   Musculoskeletal   Abdominal   Peds  Hematology negative hematology ROS (+)   Anesthesia Other Findings   Reproductive/Obstetrics negative OB ROS                            Anesthesia Physical  Anesthesia Plan  ASA: III  Anesthesia Plan: General   Post-op Pain Management:    Induction:   PONV Risk Score and Plan:   Airway Management Planned: Oral ETT and LMA  Additional Equipment:   Intra-op Plan:   Post-operative Plan:   Informed Consent: I have reviewed the patients History and Physical, chart, labs and discussed the procedure including the risks, benefits and alternatives for the proposed anesthesia with the patient or authorized representative who has indicated his/her understanding and acceptance.   Dental Advisory Given  Plan Discussed with: CRNA,  Surgeon and Anesthesiologist  Anesthesia Plan Comments: (EKG: 06/29/2018: Normal sinus rhythm. Rate 79. Left axis deviation. Right bundle branch block. Inferior infarct , age undetermined  03/07/2018 (outside record, copy on pt chart): NSR with first-degree AV block.  Rate 90.  RBBB.  LAFB.  Possible lateral infarct (cited on or before March 04, 2017).  Cannot rule out inferior infarct (cited on or before March 04, 2017).  When compared with previous ECG, nonspecific T wave abnormality has replaced inverted T waves in inferior leads. CV: Dobutamine stress echo 12/27/2015 (echo results on pt chart, dobutamine stress results in Epic): Overall impression:  Negative study for ischemia by EKG criteria. See echo results.   Echo Summary: 1.  The left ventricle is normal in size.  Left ventricular systolic function is normal.  Ejection fraction = 55 - 60%.  No regional wall motion abnormalities noted.  Grade 1 diastolic dysfunction.  There is mild concentric left ventricular hypertrophy. 2.  The right ventricle is normal size and function. 3.  No significant valvular stenosis or regurgitation. 4.  Right ventricular systolic pressure is normal.  Right ventricular systolic pressure is estimated at 27 to 32 mmHg.)       Anesthesia Quick Evaluation

## 2018-07-04 NOTE — Anesthesia Procedure Notes (Signed)
Procedure Name: Intubation Date/Time: 07/04/2018 11:52 AM Performed by: Leonor Liv, CRNA Pre-anesthesia Checklist: Patient identified, Emergency Drugs available, Suction available and Patient being monitored Patient Re-evaluated:Patient Re-evaluated prior to induction Oxygen Delivery Method: Circle System Utilized Preoxygenation: Pre-oxygenation with 100% oxygen Induction Type: IV induction Ventilation: Mask ventilation without difficulty Laryngoscope Size: Mac and 4 Grade View: Grade I Tube type: Oral Tube size: 7.5 mm Number of attempts: 1 Airway Equipment and Method: Stylet and Oral airway Placement Confirmation: ETT inserted through vocal cords under direct vision,  positive ETCO2 and breath sounds checked- equal and bilateral Secured at: 23 cm Tube secured with: Tape Dental Injury: Teeth and Oropharynx as per pre-operative assessment

## 2018-07-04 NOTE — Anesthesia Procedure Notes (Signed)
Anesthesia Regional Block: TAP block   Pre-Anesthetic Checklist: ,, timeout performed, Correct Patient, Correct Site, Correct Laterality, Correct Procedure, Correct Position, site marked, Risks and benefits discussed,  Surgical consent,  Pre-op evaluation,  At surgeon's request and post-op pain management  Laterality: Left and Right  Prep: chloraprep       Needles:  Injection technique: Single-shot  Needle Type: Echogenic Stimulator Needle     Needle Length: 5cm  Needle Gauge: 22     Additional Needles:   Procedures:, nerve stimulator,,, ultrasound used (permanent image in chart),,,,  Narrative:  Start time: 07/04/2018 11:00 AM End time: 07/04/2018 11:15 AM Injection made incrementally with aspirations every 5 mL.  Performed by: Personally  Anesthesiologist: Janeece Riggers, MD  Additional Notes: Functioning IV was confirmed and monitors were applied.  A 30mm 22ga Arrow echogenic stimulator needle was used. Sterile prep and drape,hand hygiene and sterile gloves were used. Ultrasound guidance: relevant anatomy identified, needle position confirmed, local anesthetic spread visualized around nerve(s)., vascular puncture avoided.  Image printed for medical record. Negative aspiration and negative test dose prior to incremental administration of local anesthetic. The patient tolerated the procedure well.

## 2018-07-04 NOTE — Discharge Instructions (Signed)
[facility] ° °UMBILICAL OR INGUINAL HERNIA REPAIR: POST OP INSTRUCTIONS ° °Always review your discharge instruction sheet given to you by the facility where your surgery was performed. °IF YOU HAVE DISABILITY OR FAMILY LEAVE FORMS, YOU MUST BRING THEM TO THE OFFICE FOR PROCESSING.   °DO NOT GIVE THEM TO YOUR DOCTOR. ° °1. A  prescription for pain medication Schuld be given to you upon discharge.  Take your pain medication as prescribed, if needed.  If narcotic pain medicine is not needed, then you Kuzniar take acetaminophen (Tylenol), naprosyn (Alleve) or ibuprofen (Advil) as needed. °2. Take your usually prescribed medications unless otherwise directed. °3. If you need a refill on your pain medication, please contact your pharmacy.  They will contact our office to request authorization. Prescriptions will not be filled after 5 pm or on week-ends. °4. You should follow a light diet the first 24 hours after arrival home, such as soup and crackers, etc.  Be sure to include lots of fluids daily.  Resume your normal diet the day after surgery. °5. Most patients will experience some swelling and bruising around the umbilicus or in the groin and scrotum.  Ice packs and reclining will help.  Swelling and bruising can take several days to resolve.  °6. It is common to experience some constipation if taking pain medication after surgery.  Increasing fluid intake and taking a stool softener (such as Colace) will usually help or prevent this problem from occurring.  A mild laxative (Milk of Magnesia or Miralax) should be taken according to package directions if there are no bowel movements after 48 hours. °7. Unless discharge instructions indicate otherwise, you Mcshea remove your bandages 48 hours after surgery, and you Mirkin shower at that time.  You Onnen have steri-strips (small skin tapes) in place directly over the incision.  These strips should be left on the skin for 7-10 days and will come off on their own.   If your surgeon used skin glue on the incision, you Gilmore shower in 24 hours.  The glue will flake off over the next 2-3 weeks.  Any sutures or staples will be removed at the office during your follow-up visit. °8. ACTIVITIES:  You Villavicencio resume regular (light) daily activities beginning the next day--such as daily self-care, walking, climbing stairs--gradually increasing activities as tolerated.  You Egley have sexual intercourse when it is comfortable.  Refrain from any heavy lifting or straining until approved by your doctor. °a. You Parlee drive when you are no longer taking prescription pain medication, you can comfortably wear a seatbelt, and you can safely maneuver your car and apply brakes. °b. RETURN TO WORK:  __________________________________________________________ °9. You should see your doctor in the office for a follow-up appointment approximately 2-3 weeks after your surgery.  Make sure that you call for this appointment within a day or two after you arrive home to insure a convenient appointment time. °10. OTHER INSTRUCTIONS:  __________________________________________________________________________________________________________________________________________________________________________________________  °WHEN TO CALL YOUR DOCTOR: °1. Fever over 101.0 °2. Inability to urinate °3. Nausea and/or vomiting °4. Extreme swelling or bruising °5. Continued bleeding from incision. °6. Increased pain, redness, or drainage from the incision ° °The clinic staff is available to answer your questions during regular business hours.  Please don’t hesitate to call and ask to speak to one of the nurses for clinical concerns.  If you have a medical emergency, go to the nearest emergency room or call 911.  A surgeon from [facility]   is always on call at the hospital ° ° °[address], [address], [place], [place]  [address] ? ° [address] [address], [place], [place]   [address] °[phone] ? [phone] ? FAX  [phone] °Web site: [email or web address] ° ° °

## 2018-07-04 NOTE — Interval H&P Note (Signed)
History and Physical Interval Note:  07/04/2018 11:15 AM  Francisco Howe  has presented today for surgery, with the diagnosis of UMBILICAL HERNIA  The various methods of treatment have been discussed with the patient and family. After consideration of risks, benefits and other options for treatment, the patient has consented to  Procedure(s): POSSIBLE LAPAROSCOPY DIAGNOSTIC (N/A) UMBILCAL HERNIA REPAIR WITH MESH (N/A) as a surgical intervention .  The patient's history has been reviewed, patient examined, no change in status, stable for surgery.  I have reviewed the patient's chart and labs.  Questions were answered to the patient's satisfaction.     Rolm Bookbinder

## 2018-07-04 NOTE — Op Note (Signed)
Preoperative diagnosis: umbilical hernia Postoperative diagnosis: same as above Procedure: primary umbilical hernia repair Surgeon Dr Serita Grammes Anesthesia: general with TAP blocks EBL: minimal Complications none Drains none Sponge and needle count correct at completion dispo to recovery stable.  Indications: This is a 21 yom with a symptomatic umbilical hernia who desires repair.  We discussed open repair possibly with mesh and possible diagnostic laparoscopy. Risks discussed.  Procedure: After informed consent obtained patient was taken to the OR. He underwent bilateral TAP blocks. He was given antibiotics and SCDs were in place. He was placed under general anesthesia without complication. He was prepped and draped in the standard sterile surgical fashion.  Surgical timeout was performed.  I reduced the hernia.  I then made an curvilinear incision and dissected the stalk and divided it.  I removed the hernia sac.  The hernia was only about 1.2 cm in size and there was no tension.  I elected to fix this primarily.  I used about 6 #1 ethibond sutures to close the defect without tension.  I then debrided some of the excess umbilical skin. I tacked the umbilicus down with 3-0 vicryl the incision was closed with 3-0 vicryl and 4-0 monocryl.  I placed glue and steristrips. He tolerated well and was extubated in the OR.

## 2018-07-04 NOTE — Transfer of Care (Signed)
Immediate Anesthesia Transfer of Care Note  Patient: Francisco Howe  Procedure(s) Performed: [facility] HERNIA REPAIR (N/A )  Patient Location: PACU  Anesthesia Type:General  Level of Consciousness: awake, alert  and oriented  Airway & Oxygen Therapy: Patient Spontanous Breathing and Patient connected to nasal cannula oxygen  Post-op Assessment: Report given to RN, Post -op Vital signs reviewed and stable and Patient moving all extremities  Post vital signs: Reviewed and stable  Last Vitals:  Vitals Value Taken Time  BP 111/74 07/04/2018 12:47 PM  Temp    Pulse 81 07/04/2018 12:48 PM  Resp 13 07/04/2018 12:47 PM  SpO2 94 % 07/04/2018 12:48 PM  Vitals shown include unvalidated device data.  Last Pain:  Vitals:   07/04/18 0934  TempSrc:   PainSc: 0-No pain      Patients Stated Pain Goal: 3 (95/74/73 4037)  Complications: No apparent anesthesia complications

## 2018-07-04 NOTE — Anesthesia Postprocedure Evaluation (Signed)
Anesthesia Post Note  Patient: Francisco Howe  Procedure(s) Performed: UMBILCAL HERNIA REPAIR (N/A )     Patient location during evaluation: PACU Anesthesia Type: General Level of consciousness: awake and alert Pain management: pain level controlled Vital Signs Assessment: post-procedure vital signs reviewed and stable Respiratory status: spontaneous breathing, nonlabored ventilation, respiratory function stable and patient connected to nasal cannula oxygen Cardiovascular status: blood pressure returned to baseline and stable Postop Assessment: no apparent nausea or vomiting Anesthetic complications: no    Last Vitals:  Vitals:   07/04/18 1310 07/04/18 1330  BP: 100/74 94/65  Pulse:    Resp: 19 19  Temp:    SpO2:  94%    Last Pain:  Vitals:   07/04/18 1330  TempSrc:   PainSc: 0-No pain                 Jannetta Massey

## 2018-07-04 NOTE — H&P (Signed)
72 yom with umbilical hernia that has been present for some time. always reduces. not painful. always out when standing. this has gotten bigger and he would like to consider repair   Past Surgical History  Cataract Surgery  Bilateral. Colon Polyp Removal - Colonoscopy  Coronary Artery Bypass Graft  Foot Surgery  Bilateral. Knee Surgery  Right. Shoulder Surgery  Left. Spinal Surgery - Lower Back  Vasectomy   Diagnostic Studies History  Colonoscopy  >10 years ago  Allergies  No Known Drug Allergies  Allergies Reconciled   Medication History  Fish Oil (1200MG  Capsule, Oral) Active. Calcium Carbonate (1250 (500 Ca)MG Tablet, Oral) Active. Vitamin B12 (500MCG Tablet, Oral) Active. Cholecalciferol (1000UNIT Capsule, Oral) Active. metFORMIN HCl (500MG  Tablet, Oral) Active. Allopurinol (300MG  Tablet, Oral) Active. Losartan Potassium (50MG  Tablet, Oral) Active. amLODIPine Besylate (10MG  Tablet, Oral) Active. Metoprolol Tartrate (37.5MG  Tablet, Oral) Active. Lantus (100UNIT/ML Solution, Subcutaneous) Active. Ozempic (1MG /DOSE Soln Pen-inj, Subcutaneous) Active. Medications Reconciled  Social History  Alcohol use  Remotely quit alcohol use. Caffeine use  Tea. No drug use  Tobacco use  Former smoker.  Family History Alcohol Abuse  Brother, Daughter, Sister. Depression  Sister. Diabetes Mellitus  Sister. Heart Disease  Mother. Heart disease in male family member before age 36  Prostate Cancer  Brother. Respiratory Condition  Brother. Thyroid problems  Sister.  Other Problems  Arthritis  Back Pain  Congestive Heart Failure  Diabetes Mellitus  Enlarged Prostate  High blood pressure  Hypercholesterolemia  Melanoma  Myocardial infarction  Other disease, cancer, significant illness  Umbilical Hernia Repair    Review of Systems  General Present- Weight Loss. Not Present- Appetite Loss, Chills, Fatigue, Fever, Night Sweats  and Weight Gain. Skin Not Present- Change in Wart/Mole, Dryness, Hives, Jaundice, New Lesions, Non-Healing Wounds, Rash and Ulcer. HEENT Not Present- Earache, Hearing Loss, Hoarseness, Nose Bleed, Oral Ulcers, Ringing in the Ears, Seasonal Allergies, Sinus Pain, Sore Throat, Visual Disturbances, Wears glasses/contact lenses and Yellow Eyes. Respiratory Not Present- Bloody sputum, Chronic Cough, Difficulty Breathing, Snoring and Wheezing. Breast Not Present- Breast Mass, Breast Pain, Nipple Discharge and Skin Changes. Gastrointestinal Not Present- Abdominal Pain, Bloating, Bloody Stool, Change in Bowel Habits, Chronic diarrhea, Constipation, Difficulty Swallowing, Excessive gas, Gets full quickly at meals, Hemorrhoids, Indigestion, Nausea, Rectal Pain and Vomiting. Male Genitourinary Present- Change in Urinary Stream, Frequency and Impotence. Not Present- Blood in Urine, Nocturia, Painful Urination, Urgency and Urine Leakage. Musculoskeletal Present- Back Pain. Not Present- Joint Pain, Joint Stiffness, Muscle Pain, Muscle Weakness and Swelling of Extremities. Neurological Present- Trouble walking. Not Present- Decreased Memory, Fainting, Headaches, Numbness, Seizures, Tingling, Tremor and Weakness. Psychiatric Present- Change in Sleep Pattern. Not Present- Anxiety, Bipolar, Depression, Fearful and Frequent crying. Endocrine Present- Heat Intolerance and New Diabetes. Not Present- Cold Intolerance, Excessive Hunger, Hair Changes and Hot flashes.  Vitals Weight: 225.6 lb Height: 72in Body Surface Area: 2.24 m Body Mass Index: 30.6 kg/m  Temp.: 98.64F  Pulse: 90 (Regular)  Physical Exam General Mental Status-Alert. Eye Sclera/Conjunctiva - Bilateral-No scleral icterus. Chest and Lung Exam Chest and lung exam reveals -quiet, even and easy respiratory effort with no use of accessory muscles and on auscultation, normal breath sounds, no adventitious sounds and normal vocal  resonance. Cardiovascular Cardiovascular examination reveals -normal heart sounds, regular rate and rhythm with no murmurs. Abdomen Note: soft nt/nd 1.5 cm reducible umbilical hernia with a lot of extra skin present Neurologic Neurologic evaluation reveals -alert and oriented x 3 with no impairment of recent or remote memory.  Assessment & Plan  UMBILICAL HERNIA (J62.8) Story: Umbilical hernia repair with mesh, possible diagnostic laparoscopy We discussed observation versus repair. We discussed both laparoscopic and open hernia repairs. I described the procedure in detail. Goals should be achieved with surgery. We discussed the usage of mesh and the rationale behind that. We went over the pathophysiology of umbilical hernia. We discussed the risks including bleeding, infection, recurrence, postoperative pain as well as cardiac risks

## 2018-07-05 ENCOUNTER — Encounter (HOSPITAL_COMMUNITY): Payer: Self-pay | Admitting: General Surgery

## 2018-07-14 NOTE — Progress Notes (Deleted)
HPI: FU CAD; s/p OOH MI and subsequent CABG in 2004, DM2, HTN, HL. Abdominal ultrasound in December of 2008 showed no aneurysm. Nuclear study January 2015 showed ejection fraction 55%, minimally reversible defect in the inferior lateral wall. Carotid Dopplers at the [facility] in December 2016 showed no significant obstruction.  Patient apparently had a functional study at the [place] in June 2017 that was negative but full results not available. Since he was last seen,   Current Outpatient Medications  Medication Sig Dispense Refill  . allopurinol (ZYLOPRIM) 300 MG tablet Take 300 mg by mouth daily.    Marland Kitchen amLODipine (NORVASC) 10 MG tablet Take 10 mg by mouth every morning.     . Calcium-Magnesium-Vitamin D (CALCIUM 1200+D3 PO) Take 1 tablet by mouth daily.    . cholecalciferol (VITAMIN D) 1000 UNITS tablet Take 1,000 Units by mouth daily.    . ciprofloxacin-dexamethasone (CIPRODEX) OTIC suspension Place 4 drops into the right ear 2 (two) times daily.    . Cyanocobalamin (VITAMIN B-12) 5000 MCG TBDP Take 5,000 mcg by mouth every Monday, Wednesday, and Friday.    . docusate sodium 100 MG CAPS Take 100 mg by mouth 2 (two) times daily. (Patient not taking: Reported on 06/21/2018) 10 capsule 0  . HYDROcodone-acetaminophen (NORCO) 7.5-325 MG per tablet Take 1-2 tablets by mouth every 4 (four) hours as needed for moderate pain. (Patient not taking: Reported on 06/21/2018) 100 tablet 0  . insulin glargine (LANTUS) 100 UNIT/ML injection Inject 80 Units into the skin daily.    Marland Kitchen losartan (COZAAR) 100 MG tablet Take 50 mg by mouth daily.     . metFORMIN (GLUCOPHAGE) 1000 MG tablet Take 500-1,000 mg by mouth See admin instructions. Take 1000 mg by mouth in the morning and 500 mg at bedtime    . metoprolol tartrate (LOPRESSOR) 25 MG tablet Take 12.5 mg by mouth 2 (two) times daily.     . nitroGLYCERIN (NITROSTAT) 0.4 MG SL tablet Place 0.4 mg under the tongue every 5 (five) minutes as needed for chest pain.    .  Omega-3 Fatty Acids (FISH OIL) 1200 MG CAPS Take 1,200 mg by mouth daily.    Marland Kitchen oxyCODONE (OXY IR/ROXICODONE) 5 MG immediate release tablet Take 1 tablet (5 mg total) by mouth every 6 (six) hours as needed for moderate pain, severe pain or breakthrough pain. 12 tablet 0  . polyethylene glycol (MIRALAX / GLYCOLAX) packet Take 17 g by mouth 2 (two) times daily. (Patient not taking: Reported on 06/21/2018) 14 each 0  . Semaglutide, 1 MG/DOSE, (OZEMPIC, 1 MG/DOSE,) 2 MG/1.5ML SOPN Inject 1 mg into the skin once a week.    . tacrolimus (PROGRAF) 1 MG capsule Take 1 mg by mouth 2 (two) times daily. Open capsule and mix with water and swish and spit 20 mls twice daily Pt was using for lichen planus of gums -  No longer has the lichen planus - but has the medication to use again if needed     No current facility-administered medications for this visit.      Past Medical History:  Diagnosis Date  . Arthritis    RA - MOSTLY IN HANDS; OA AND PAIN BOTH KNEES - RIGHT KNEE PAIN WORSE  . CAD (coronary artery disease)    a. s/p CABG 02/2003:  L-Dx, L radial-OM1, S-OM2/dCFX, S-RCA;  b.  Myoview 12/09: no ischemia, EF 59%  . Cancer (HCC)    SKIN CANCER INSIDE OF LEFT EAR REMOVED  .  Carotid stenosis    a. dopplers 8/84:  RICA 1-66%, LICA 06-30%  . Complication of anesthesia    PT TOLD THAT DURING HIS HEART SURGERY - BY DR. Servando Snare - HE HAD PROFUSE SWEATING AND  AWARENESS DURING THE SURGERY AND REMEMBERS FEELING OF HEAVINESS IN CHEST  . Diabetes mellitus ([place])   . Dyspnea    upon exertion  . Gout   . Hernia, umbilical    PT STATES HE HAS UMBILICAL HERNIA - NO C/O OF PAIN   . Hx of lichen planus    RIGHT LOWER LEG AND GUMS - TREATED - NO PROBLEMS AT PRESENT TIME 02/20/14  . Hypercholesteremia   . Hypertension   . Myocardial infarction [facility])     Past Surgical History:  Procedure Laterality Date  . BACK SURGERY    . BYPASS GRAFT  2004   x5  . CORONARY ARTERY BYPASS GRAFT  2004  . KNEE SURGERY    .  ROTATOR CUFF REPAIR     left  . TOE SURGERY     gout  . TOTAL KNEE ARTHROPLASTY Right 02/27/2014   Procedure: RIGHT TOTAL KNEE ARTHROPLASTY;  Surgeon: Mauri Pole, MD;  Location: [facility];  Service: Orthopedics;  Laterality: Right;  . UMBILICAL HERNIA REPAIR N/A 07/04/2018   Procedure: UMBILCAL HERNIA REPAIR;  Surgeon: Rolm Bookbinder, MD;  Location: [place];  Service: General;  Laterality: N/A;    Social History   Socioeconomic History  . Marital status: Divorced    Spouse name: Not on file  . Number of children: Not on file  . Years of education: Not on file  . Highest education level: Not on file  Occupational History  . Not on file  Social Needs  . Financial resource strain: Not on file  . Food insecurity:    Worry: Not on file    Inability: Not on file  . Transportation needs:    Medical: Not on file    Non-medical: Not on file  Tobacco Use  . Smoking status: Former Smoker    Types: Cigarettes    Start date: 04/11/1982  . Smokeless tobacco: Never Used  Substance and Sexual Activity  . Alcohol use: No    Comment: QUIT SMOKING AROUND 2004  . Drug use: No  . Sexual activity: Not on file  Lifestyle  . Physical activity:    Days per week: Not on file    Minutes per session: Not on file  . Stress: Not on file  Relationships  . Social connections:    Talks on phone: Not on file    Gets together: Not on file    Attends religious service: Not on file    Active member of club or organization: Not on file    Attends meetings of clubs or organizations: Not on file    Relationship status: Not on file  . Intimate partner violence:    Fear of current or ex partner: Not on file    Emotionally abused: Not on file    Physically abused: Not on file    Forced sexual activity: Not on file  Other Topics Concern  . Not on file  Social History Narrative  . Not on file    Family History  Problem Relation Age of Onset  . Heart disease Mother   . Diabetes Mother   . Heart  failure Mother   . Addison's disease Sister   . AAA (abdominal aortic aneurysm) Sister   . Heart disease Sister  pace Agricultural engineer  . Cancer Father        colon  . Heart attack Maternal Grandfather     ROS: no fevers or chills, productive cough, hemoptysis, dysphasia, odynophagia, melena, hematochezia, dysuria, hematuria, rash, seizure activity, orthopnea, PND, pedal edema, claudication. Remaining systems are negative.  Physical Exam: Well-developed well-nourished in no acute distress.  Skin is warm and dry.  HEENT is normal.  Neck is supple.  Chest is clear to auscultation with normal expansion.  Cardiovascular exam is regular rate and rhythm.  Abdominal exam nontender or distended. No masses palpated. Extremities show no edema. neuro grossly intact  ECG- personally reviewed  A/P  1 coronary artery disease status post coronary artery bypass graft-patient denies chest pain.  Continue medical therapy with aspirin.  He is intolerant to statins.  2 hyperlipidemia-  3 hypertension-patient's blood pressure is controlled.  Continue present medications and follow.  4 carotid artery disease-continue aspirin.  As outlined last carotid Dopplers showed no significant stenosis.  Followed at the [place].  Kirk Ruths, MD

## 2018-07-26 ENCOUNTER — Ambulatory Visit: Payer: Medicare Other | Admitting: Cardiology

## 2018-08-08 ENCOUNTER — Encounter: Payer: Self-pay | Admitting: Cardiology

## 2018-08-30 DIAGNOSIS — M79671 Pain in right foot: Secondary | ICD-10-CM | POA: Insufficient documentation

## 2019-01-30 ENCOUNTER — Ambulatory Visit (INDEPENDENT_AMBULATORY_CARE_PROVIDER_SITE_OTHER): Payer: Medicare HMO

## 2019-01-30 ENCOUNTER — Encounter: Payer: Self-pay | Admitting: Orthopedic Surgery

## 2019-01-30 ENCOUNTER — Other Ambulatory Visit: Payer: Self-pay

## 2019-01-30 ENCOUNTER — Ambulatory Visit (INDEPENDENT_AMBULATORY_CARE_PROVIDER_SITE_OTHER): Payer: Medicare HMO | Admitting: Orthopedic Surgery

## 2019-01-30 VITALS — Ht 72.0 in | Wt 217.0 lb

## 2019-01-30 DIAGNOSIS — M1A0710 Idiopathic chronic gout, right ankle and foot, without tophus (tophi): Secondary | ICD-10-CM

## 2019-01-30 DIAGNOSIS — E1142 Type 2 diabetes mellitus with diabetic polyneuropathy: Secondary | ICD-10-CM

## 2019-01-30 DIAGNOSIS — M79674 Pain in right toe(s): Secondary | ICD-10-CM

## 2019-01-30 NOTE — Progress Notes (Signed)
Office Visit Note   Patient: Francisco Howe           Date of Birth: 1945/08/24           MRN: [id number] Visit Date: 01/30/2019              Requested by: Gerome Sam, MD [place],  [place] [address] PCP: Gerome Sam, MD  No chief complaint on file.     HPI: Patient is a 73 year old gentleman with poorly controlled type 2 diabetes status post total knee arthroplasty on the right who has overlapping of the great toe and second toe has been using a silicone spacer and has had blisters and ulcers between the toes.  Patient complains of pain to the IP joint and MTP joint of the great toe.  Assessment & Plan: Visit Diagnoses:  1. Great toe pain, right   2. Diabetic polyneuropathy associated with type 2 diabetes mellitus ([place])   3. 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  We will obtain a uric acid level to see if we need to adjust his allopurinol.  Follow-Up Instructions: Return in about 4 weeks (around 02/27/2019).   Ortho Exam  Patient is alert, oriented, no adenopathy, well-dressed, normal affect, normal respiratory effort. Examination patient has a palpable dorsalis pedis pulse there is no redness no cellulitis he does have hallux valgus deformity of the great toe with overlapping of the second toe on top of the great toe.  There are pressure areas from the overlapping.  Patient has a palpable dorsalis pedis pulse he has significant hallux rigidus of the great toe with dorsiflexion only 10 degrees.  He has palpable bony spurs at the MTP joint.  The question is whether the hallux rigidus is secondary to active gout or degenerative in nature.  Will order uric acid patient states his last uric acid test was about a year ago at the [place]  Imaging: Xr Foot Complete Right  Result Date:  01/30/2019 2 view radiographs of the right foot shows destructive changes of the MTP joint of the great toe as well as periarticular cystic changes consistent with chronic gallop.  There is no destructive changes of the IP joint or tuft of the great toe though radiographic signs of osteomyelitis.  No images are attached to the encounter.  Labs: Lab Results  Component Value Date   HGBA1C 7.8 (H) 06/29/2018     Lab Results  Component Value Date   ALBUMIN 4.0 09/10/2010   ALBUMIN 4.0 08/01/2010   ALBUMIN 4.0 09/20/2009    No results found for: MG No results found for: VD25OH  No results found for: PREALBUMIN CBC EXTENDED Latest Ref Rng & Units 06/29/2018 03/01/2014 02/28/2014  WBC 4.0 - 10.5 K/uL 6.7 9.4 6.8  RBC 4.22 - 5.81 MIL/uL 5.16 3.92(L) 3.94(L)  HGB 13.0 - 17.0 g/dL 15.2 12.2(L) 12.1(L)  HCT 39.0 - 52.0 % 47.1 35.5(L) 36.0(L)  PLT 150 - 400 K/uL 264 190 172     Body mass index is 29.43 kg/m.  Orders:  Orders Placed This Encounter  Procedures  . XR Foot Complete Right   No orders of the defined types were placed in this encounter.    Procedures: No procedures performed  Clinical Data: No additional findings.  ROS:  All other systems  negative, except as noted in the HPI. Review of Systems  Objective: Vital Signs: Ht 6' (1.829 m)   Wt 217 lb (98.4 kg)   BMI 29.43 kg/m   Specialty Comments:  No specialty comments available.  PMFS History: Patient Active Problem List   Diagnosis Date Noted  . Obese 02/28/2014  . Postoperative anemia due to acute blood loss 02/28/2014  . Hyponatremia 02/28/2014  . S/P right TKA 02/27/2014  . PURE HYPERCHOLESTEROLEMIA 08/01/2010  . HYPERLIPIDEMIA 08/01/2009  . GOUT 08/01/2009  . HYPERTENSION 08/01/2009  . CAD 08/01/2009  . CEREBROVASCULAR DISEASE 08/01/2009   Past Medical History:  Diagnosis Date  . Arthritis    RA - MOSTLY IN HANDS; OA AND PAIN BOTH KNEES - RIGHT KNEE PAIN WORSE  . CAD (coronary artery disease)     a. s/p CABG 02/2003:  L-Dx, L radial-OM1, S-OM2/dCFX, S-RCA;  b.  Myoview 12/09: no ischemia, EF 59%  . Cancer (HCC)    SKIN CANCER INSIDE OF LEFT EAR REMOVED  . Carotid stenosis    a. dopplers 1/09:  RICA 3-23%, LICA 55-73%  . Complication of anesthesia    PT TOLD THAT DURING HIS HEART SURGERY - BY DR. Servando Snare - HE HAD PROFUSE SWEATING AND  AWARENESS DURING THE SURGERY AND REMEMBERS FEELING OF HEAVINESS IN CHEST  . Diabetes mellitus ([place])   . Dyspnea    upon exertion  . Gout   . Hernia, umbilical    PT STATES HE HAS UMBILICAL HERNIA - NO C/O OF PAIN   . Hx of lichen planus    RIGHT LOWER LEG AND GUMS - TREATED - NO PROBLEMS AT PRESENT TIME 02/20/14  . Hypercholesteremia   . Hypertension   . Myocardial infarction [facility])     Family History  Problem Relation Age of Onset  . Heart disease Mother   . Diabetes Mother   . Heart failure Mother   . Addison's disease Sister   . AAA (abdominal aortic aneurysm) Sister   . Heart disease Sister        Psychologist, forensic  . Cancer Father        colon  . Heart attack Maternal Grandfather     Past Surgical History:  Procedure Laterality Date  . BACK SURGERY    . BYPASS GRAFT  2004   x5  . CORONARY ARTERY BYPASS GRAFT  2004  . KNEE SURGERY    . ROTATOR CUFF REPAIR     left  . TOE SURGERY     gout  . TOTAL KNEE ARTHROPLASTY Right 02/27/2014   Procedure: RIGHT TOTAL KNEE ARTHROPLASTY;  Surgeon: Mauri Pole, MD;  Location: [facility];  Service: Orthopedics;  Laterality: Right;  . UMBILICAL HERNIA REPAIR N/A 07/04/2018   Procedure: UMBILCAL HERNIA REPAIR;  Surgeon: Rolm Bookbinder, MD;  Location: [facility];  Service: General;  Laterality: N/A;   Social History   Occupational History  . Not on file  Tobacco Use  . Smoking status: Former Smoker    Types: Cigarettes    Start date: 04/11/1982  . Smokeless tobacco: Never Used  Substance and Sexual Activity  . Alcohol use: No    Comment: QUIT SMOKING AROUND 2004  . Drug use: No  . Sexual  activity: Not on file

## 2019-01-31 ENCOUNTER — Telehealth: Payer: Self-pay | Admitting: Orthopedic Surgery

## 2019-01-31 ENCOUNTER — Telehealth: Payer: Self-pay

## 2019-01-31 LAB — URIC ACID: Uric Acid, Serum: 3.9 mg/dL — ABNORMAL LOW (ref 4.0–8.0)

## 2019-01-31 NOTE — Telephone Encounter (Signed)
I called patient to discuss his treatment.  Patient states he has had pain in the big toe for over a year that he has electrical type nerve pain as well as pain with weightbearing.  I discussed that he has arthritis of the great toe MTP joint that is partially due to gout.  Discussed that he should proceed with the stiff soled sneaker to see how this resolves his symptoms and reevaluate in 4 weeks.  Discussed that if he is still symptomatic we could proceed with a fusion of the great toe MTP joint.  Discussed that it is critical for him to get the sugars under control to help with the neuropathic pain and to help with healing from surgery.  Patient states his sugars are down to running in the 300s.  Discussed that this must get down to the 100s.  Discussed that with poor glucose control he has increased risk of neuropathic pain as well as increased risk of amputation from surgery.  Patient states he understands he states he is wanted to cut his big toe off many times.  Patient states he will work on his diabetes control will use the stiff soled sneakers and we will reevaluate in 4 weeks.

## 2019-01-31 NOTE — Telephone Encounter (Signed)
-----   Message from Newt Minion, MD sent at 01/31/2019  7:07 AM EDT ----- Uric acid under good control, continue current allopurinol

## 2019-01-31 NOTE — Telephone Encounter (Signed)
I called pt to advise of the Uric acid results and he states that he does not feel that anything was done for him yesterday. He states that he did pick up the [facility]  shoes as suggested but that he has been to several different doctors and that no one address the severe pain that he is having. He wants to know if there is a medication that he can take to help with this.

## 2019-02-27 ENCOUNTER — Encounter: Payer: Self-pay | Admitting: Orthopedic Surgery

## 2019-02-27 ENCOUNTER — Ambulatory Visit (INDEPENDENT_AMBULATORY_CARE_PROVIDER_SITE_OTHER): Payer: Medicare HMO | Admitting: Orthopedic Surgery

## 2019-02-27 ENCOUNTER — Other Ambulatory Visit: Payer: Self-pay

## 2019-02-27 VITALS — Ht 72.0 in | Wt 217.0 lb

## 2019-02-27 DIAGNOSIS — M79674 Pain in right toe(s): Secondary | ICD-10-CM

## 2019-02-28 ENCOUNTER — Encounter: Payer: Self-pay | Admitting: Orthopedic Surgery

## 2019-02-28 NOTE — Progress Notes (Signed)
Office Visit Note   Patient: Francisco Howe           Date of Birth: [birth year]           MRN: [id number] Visit Date: 02/27/2019              Requested by: Gerome Sam, MD [address] [place],  [place] [address] PCP: Gerome Sam, MD  Chief Complaint  Patient presents with  . Right Great Toe - Follow-up      HPI: Patient is a 73 year old gentleman who presents in follow-up for right great toe pain.  He has been using a new pair of stiff [employer] sneakers and he states he now has no pain no throbbing states his symptoms have completely resolved.  Assessment & Plan: Visit Diagnoses:  1. Great toe pain, right     Plan: Patient will continue the stiff soled sneakers continue with Achilles stretching.  Follow-Up Instructions: Return if symptoms worsen or fail to improve.   Ortho Exam  Patient is alert, oriented, no adenopathy, well-dressed, normal affect, normal respiratory effort. Examination patient has dorsiflexion to neutral.  There is no redness no cellulitis or drainage around the great toe.  He does have a callus that was pared without complications there is no open wound or ulcers.  The blood blister had good epithelization beneath it.  Imaging: No results found. No images are attached to the encounter.  Labs: Lab Results  Component Value Date   HGBA1C 7.8 (H) 06/29/2018   LABURIC 3.9 (L) 01/30/2019     Lab Results  Component Value Date   ALBUMIN 4.0 09/10/2010   ALBUMIN 4.0 08/01/2010   ALBUMIN 4.0 09/20/2009   LABURIC 3.9 (L) 01/30/2019    No results found for: MG No results found for: VD25OH  No results found for: PREALBUMIN CBC EXTENDED Latest Ref Rng & Units 06/29/2018 03/01/2014 02/28/2014  WBC 4.0 - 10.5 K/uL 6.7 9.4 6.8  RBC 4.22 - 5.81 MIL/uL 5.16 3.92(L) 3.94(L)  HGB 13.0 - 17.0 g/dL 15.2 12.2(L) 12.1(L)  HCT 39.0 - 52.0 % 47.1 35.5(L) 36.0(L)  PLT 150 - 400 K/uL 264 190 172     Body mass index is 29.43 kg/m.   Orders:  No orders of the defined types were placed in this encounter.  No orders of the defined types were placed in this encounter.    Procedures: No procedures performed  Clinical Data: No additional findings.  ROS:  All other systems negative, except as noted in the HPI. Review of Systems  Objective: Vital Signs: Ht 6' (1.829 m)   Wt 217 lb (98.4 kg)   BMI 29.43 kg/m   Specialty Comments:  No specialty comments available.  PMFS History: Patient Active Problem List   Diagnosis Date Noted  . Obese 02/28/2014  . Postoperative anemia due to acute blood loss 02/28/2014  . Hyponatremia 02/28/2014  . S/P right TKA 02/27/2014  . PURE HYPERCHOLESTEROLEMIA 08/01/2010  . HYPERLIPIDEMIA 08/01/2009  . GOUT 08/01/2009  . HYPERTENSION 08/01/2009  . CAD 08/01/2009  . CEREBROVASCULAR DISEASE 08/01/2009   Past Medical History:  Diagnosis Date  . Arthritis    RA - MOSTLY IN HANDS; OA AND PAIN BOTH KNEES - RIGHT KNEE PAIN WORSE  . CAD (coronary artery disease)    a. s/p CABG 02/2003:  L-Dx, L radial-OM1, S-OM2/dCFX, S-RCA;  b.  Myoview 12/09: no ischemia, EF 59%  . Cancer (HCC)    SKIN CANCER INSIDE OF LEFT EAR REMOVED  .  Carotid stenosis    a. dopplers 5/97:  RICA 4-16%, LICA 38-45%  . Complication of anesthesia    PT TOLD THAT DURING HIS HEART SURGERY - BY DR. Servando Snare - HE HAD PROFUSE SWEATING AND  AWARENESS DURING THE SURGERY AND REMEMBERS FEELING OF HEAVINESS IN CHEST  . Diabetes mellitus ([place])   . Dyspnea    upon exertion  . Gout   . Hernia, umbilical    PT STATES HE HAS UMBILICAL HERNIA - NO C/O OF PAIN   . Hx of lichen planus    RIGHT LOWER LEG AND GUMS - TREATED - NO PROBLEMS AT PRESENT TIME 02/20/14  . Hypercholesteremia   . Hypertension   . Myocardial infarction [facility])     Family History  Problem Relation Age of Onset  . Heart disease Mother   . Diabetes Mother   . Heart failure Mother   . Addison's disease Sister   . AAA (abdominal aortic aneurysm)  Sister   . Heart disease Sister        Psychologist, forensic  . Cancer Father        colon  . Heart attack Maternal Grandfather     Past Surgical History:  Procedure Laterality Date  . BACK SURGERY    . BYPASS GRAFT  2004   x5  . CORONARY ARTERY BYPASS GRAFT  2004  . KNEE SURGERY    . ROTATOR CUFF REPAIR     left  . TOE SURGERY     gout  . TOTAL KNEE ARTHROPLASTY Right 02/27/2014   Procedure: RIGHT TOTAL KNEE ARTHROPLASTY;  Surgeon: Mauri Pole, MD;  [facility];  Service: Orthopedics;  Laterality: Right;  . UMBILICAL HERNIA REPAIR N/A 07/04/2018   Procedure: UMBILCAL HERNIA REPAIR;  Surgeon: Rolm Bookbinder, MD;  Location: [facility];  Service: General;  Laterality: N/A;   Social History   Occupational History  . Not on file  Tobacco Use  . Smoking status: Former Smoker    Types: Cigarettes    Start date: 04/11/1982  . Smokeless tobacco: Never Used  Substance and Sexual Activity  . Alcohol use: No    Comment: QUIT SMOKING AROUND 2004  . Drug use: No  . Sexual activity: Not on file

## 2020-09-10 ENCOUNTER — Ambulatory Visit: Payer: Medicare HMO | Admitting: Sports Medicine

## 2020-09-10 ENCOUNTER — Encounter: Payer: Self-pay | Admitting: Sports Medicine

## 2020-09-10 ENCOUNTER — Other Ambulatory Visit: Payer: Self-pay

## 2020-09-10 VITALS — BP 162/82 | HR 114 | Temp 96.1°F | Resp 12

## 2020-09-10 DIAGNOSIS — M818 Other osteoporosis without current pathological fracture: Secondary | ICD-10-CM | POA: Insufficient documentation

## 2020-09-10 DIAGNOSIS — I5189 Other ill-defined heart diseases: Secondary | ICD-10-CM | POA: Insufficient documentation

## 2020-09-10 DIAGNOSIS — M21619 Bunion of unspecified foot: Secondary | ICD-10-CM

## 2020-09-10 DIAGNOSIS — E1140 Type 2 diabetes mellitus with diabetic neuropathy, unspecified: Secondary | ICD-10-CM | POA: Insufficient documentation

## 2020-09-10 DIAGNOSIS — M19049 Primary osteoarthritis, unspecified hand: Secondary | ICD-10-CM | POA: Insufficient documentation

## 2020-09-10 DIAGNOSIS — Z87891 Personal history of nicotine dependence: Secondary | ICD-10-CM | POA: Insufficient documentation

## 2020-09-10 DIAGNOSIS — E559 Vitamin D deficiency, unspecified: Secondary | ICD-10-CM | POA: Insufficient documentation

## 2020-09-10 DIAGNOSIS — R809 Proteinuria, unspecified: Secondary | ICD-10-CM | POA: Insufficient documentation

## 2020-09-10 DIAGNOSIS — L97511 Non-pressure chronic ulcer of other part of right foot limited to breakdown of skin: Secondary | ICD-10-CM | POA: Diagnosis not present

## 2020-09-10 DIAGNOSIS — I350 Nonrheumatic aortic (valve) stenosis: Secondary | ICD-10-CM | POA: Insufficient documentation

## 2020-09-10 DIAGNOSIS — Z961 Presence of intraocular lens: Secondary | ICD-10-CM | POA: Insufficient documentation

## 2020-09-10 DIAGNOSIS — I517 Cardiomegaly: Secondary | ICD-10-CM | POA: Insufficient documentation

## 2020-09-10 DIAGNOSIS — I739 Peripheral vascular disease, unspecified: Secondary | ICD-10-CM | POA: Diagnosis not present

## 2020-09-10 DIAGNOSIS — L97519 Non-pressure chronic ulcer of other part of right foot with unspecified severity: Secondary | ICD-10-CM | POA: Diagnosis not present

## 2020-09-10 DIAGNOSIS — N400 Enlarged prostate without lower urinary tract symptoms: Secondary | ICD-10-CM | POA: Insufficient documentation

## 2020-09-10 DIAGNOSIS — J42 Unspecified chronic bronchitis: Secondary | ICD-10-CM | POA: Insufficient documentation

## 2020-09-10 DIAGNOSIS — I700 Atherosclerosis of aorta: Secondary | ICD-10-CM | POA: Insufficient documentation

## 2020-09-10 DIAGNOSIS — F528 Other sexual dysfunction not due to a substance or known physiological condition: Secondary | ICD-10-CM | POA: Insufficient documentation

## 2020-09-10 DIAGNOSIS — M064 Inflammatory polyarthropathy: Secondary | ICD-10-CM | POA: Insufficient documentation

## 2020-09-10 DIAGNOSIS — I714 Abdominal aortic aneurysm, without rupture, unspecified: Secondary | ICD-10-CM | POA: Insufficient documentation

## 2020-09-10 DIAGNOSIS — E0842 Diabetes mellitus due to underlying condition with diabetic polyneuropathy: Secondary | ICD-10-CM | POA: Diagnosis not present

## 2020-09-10 DIAGNOSIS — K429 Umbilical hernia without obstruction or gangrene: Secondary | ICD-10-CM | POA: Insufficient documentation

## 2020-09-10 DIAGNOSIS — M79643 Pain in unspecified hand: Secondary | ICD-10-CM | POA: Insufficient documentation

## 2020-09-10 NOTE — Progress Notes (Signed)
Subjective: Francisco Howe is a 75 y.o. male patient seen in office for evaluation of ulceration of the right 1st toe. Patient has a history of diabetes and a blood glucose level  today of 123 mg/dl and A1c 8.   Patient is changing the dressing using antibiotic cream with help from wife. Denies nausea/fever/vomiting/chills/night sweats/shortness of breath but does admit some pain sharp shooting, has a history of neuropathy and previous bunion surgery due to gout. Patient has no other pedal complaints at this time.  Patient Active Problem List   Diagnosis Date Noted  . Abdominal aortic aneurysm ([place]) 09/10/2020  . Abdominal aortic atherosclerosis ([place]) 09/10/2020  . Aortic valve stenosis 09/10/2020  . Bilateral pseudophakia 09/10/2020  . Chronic bronchitis ([place]) 09/10/2020  . Degenerative joint disease of hand 09/10/2020  . Diabetic neuropathy ([place]) 09/10/2020  . Diastolic dysfunction 21/30/8657  . Ex-smoker 09/10/2020  . Hand pain 09/10/2020  . Hypertrophy of prostate without urinary obstruction and other lower urinary tract symptoms (LUTS) 09/10/2020  . Left ventricular hypertrophy 09/10/2020  . Microalbuminuria 09/10/2020  . Inflammatory polyarthropathy ([place]) 09/10/2020  . Other osteoporosis 09/10/2020  . Psychosexual dysfunction with inhibited sexual excitement 09/10/2020  . Umbilical hernia 84/69/6295  . Vitamin D deficiency 09/10/2020  . Pain in right foot 08/30/2018  . Chest pain 05/26/2016  . Hx of CABG 05/26/2016  . Obese 02/28/2014  . Postoperative anemia due to acute blood loss 02/28/2014  . Hyponatremia 02/28/2014  . S/P right TKA 02/27/2014  . Folliculitis 28/41/3244  . Oral lichen planus 07/29/7251  . Diabetes mellitus ([place]) 01/29/2012  . PURE HYPERCHOLESTEROLEMIA 08/01/2010  . HYPERLIPIDEMIA 08/01/2009  . GOUT 08/01/2009  . HYPERTENSION 08/01/2009  . CAD 08/01/2009  . CEREBROVASCULAR DISEASE 08/01/2009  . Tubular adenoma of colon 07/27/2005   Current Outpatient  Medications on File Prior to Visit  Medication Sig Dispense Refill  . ezetimibe (ZETIA) 10 MG tablet Take 0.5 tablets by mouth daily.    Marland Kitchen ezetimibe-simvastatin (VYTORIN) 10-40 MG tablet Take 1 tablet by mouth daily.    . insulin aspart (NOVOLOG) 100 UNIT/ML FlexPen INJECT 48 UNITS SUBCUTANEOUSLY ONCE A DAY WITH SUPPER AS DIRECTED FOR DIABETES.    . sildenafil (VIAGRA) 100 MG tablet Take 1 tablet by mouth as needed.    Marland Kitchen allopurinol (ZYLOPRIM) 300 MG tablet Take 300 mg by mouth daily.    Marland Kitchen amLODipine (NORVASC) 10 MG tablet Take 10 mg by mouth every morning.     . Calcium-Magnesium-Vitamin D (CALCIUM 1200+D3 PO) Take 1 tablet by mouth daily.    . cholecalciferol (VITAMIN D) 1000 UNITS tablet Take 1,000 Units by mouth daily.    . ciprofloxacin-dexamethasone (CIPRODEX) OTIC suspension Place 4 drops into the right ear 2 (two) times daily.    . Cyanocobalamin (VITAMIN B-12) 5000 MCG TBDP Take 5,000 mcg by mouth every Monday, Wednesday, and Friday.    . docusate sodium 100 MG CAPS Take 100 mg by mouth 2 (two) times daily. 10 capsule 0  . doxycycline (VIBRAMYCIN) 100 MG capsule doxycycline hyclate 100 mg capsule    . HYDROcodone-acetaminophen (NORCO) 7.5-325 MG per tablet Take 1-2 tablets by mouth every 4 (four) hours as needed for moderate pain. 100 tablet 0  . insulin glargine (LANTUS) 100 UNIT/ML injection Inject 80 Units into the skin daily.    Marland Kitchen losartan (COZAAR) 100 MG tablet Take 50 mg by mouth daily.     . metFORMIN (GLUCOPHAGE) 1000 MG tablet Take 500-1,000 mg by mouth See admin instructions. Take 1000  mg by mouth in the morning and 500 mg at bedtime    . metoprolol tartrate (LOPRESSOR) 25 MG tablet Take 12.5 mg by mouth 2 (two) times daily.     . nitroGLYCERIN (NITROSTAT) 0.4 MG SL tablet Place 0.4 mg under the tongue every 5 (five) minutes as needed for chest pain.    . Omega-3 Fatty Acids (FISH OIL) 1200 MG CAPS Take 1,200 mg by mouth daily.    Marland Kitchen oxyCODONE (OXY IR/ROXICODONE) 5 MG immediate  release tablet Take 1 tablet (5 mg total) by mouth every 6 (six) hours as needed for moderate pain, severe pain or breakthrough pain. 12 tablet 0  . polyethylene glycol (MIRALAX / GLYCOLAX) packet Take 17 g by mouth 2 (two) times daily. 14 each 0  . Semaglutide, 1 MG/DOSE, (OZEMPIC, 1 MG/DOSE,) 2 MG/1.5ML SOPN Inject 1 mg into the skin once a week.    . tacrolimus (PROGRAF) 1 MG capsule Take 1 mg by mouth 2 (two) times daily. Open capsule and mix with water and swish and spit 20 mls twice daily Pt was using for lichen planus of gums -  No longer has the lichen planus - but has the medication to use again if needed     No current facility-administered medications on file prior to visit.   Allergies  Allergen Reactions  . Lisinopril   . Statins     Joint pain  . Vytorin [Ezetimibe-Simvastatin]     unknown    No results found for this or any previous visit (from the past 2160 hour(s)).  Objective: There were no vitals filed for this visit.  General: Patient is awake, alert, oriented x 3 and in no acute distress.  Dermatology: Skin is warm and dry bilateral with a partial thickness ulceration present  Right 1st toe medial aspect. Ulceration measures 0.5 cm x 0.3 cm x 0.1cm. There is a  keratotic border with a granular base. The ulceration does not  probe to bone. There is no malodor, no active drainage, no erythema, no edema. No acute signs of infection.   Vascular: Dorsalis Pedis pulse = 1/4 Bilateral,  Posterior Tibial pulse = 1/4 Bilateral,  Capillary Fill Time < 5 seconds  Neurologic: Protective sensation absent bilateral using the 5.07/10g Semmes Weinstein Monofilament.  Musculosketal: + Bunion and hammertoe bony deformities noted bilateral.   No results for input(s): GRAMSTAIN, LABORGA in the last 8760 hours.  Assessment and Plan:  Problem List Items Addressed This Visit      Endocrine   Diabetic neuropathy ([place])   Relevant Medications   insulin aspart (NOVOLOG) 100  UNIT/ML FlexPen    Other Visit Diagnoses    Right second toe ulcer, limited to breakdown of skin ([place])    -  Primary   Bunion       PVD (peripheral vascular disease) ([place])       Relevant Medications   ezetimibe (ZETIA) 10 MG tablet   ezetimibe-simvastatin (VYTORIN) 10-40 MG tablet   sildenafil (VIAGRA) 100 MG tablet      -Examined patient and discussed the progression of the wound and treatment alternatives. - Excisionally dedbrided ulceration at right great toe to healthy bleeding borders removing nonviable tissue using a sterile chisel blade. Wound measures post debridement ** cm. Wound was debrided to the level of the dermis with viable wound base exposed to promote healing. Hemostasis was achieved with manuel pressure. Patient tolerated procedure well without any discomfort or anesthesia necessary for this wound debridement.  -We will call  patient if he needs to start on antibiotics -Applied betadine and dry sterile dressing and instructed patient to continue with daily dressings at home consisting of the same -Continue with shoes that do not rub toes - Advised patient to go to the ER or return to office if the wound worsens or if constitutional symptoms are present. -Patient to return to office in 2 weeks for follow up care and evaluation or sooner if problems arise. If wound is no better will xray next visit.   Landis Martins, DPM

## 2020-09-11 ENCOUNTER — Telehealth: Payer: Self-pay | Admitting: *Deleted

## 2020-09-11 NOTE — Telephone Encounter (Signed)
Called patient today to see how he was doing due to patient did not feel good yesterday and patient stated that he went and got a whopper from [facility] and really could not even taste it and throw the bread away and was a little nauseous last night while in the bed and his wife gave him a nausea medicine to help and I stated to call the office if any concerns or questions. Lattie Haw

## 2020-09-13 ENCOUNTER — Telehealth: Payer: Self-pay | Admitting: *Deleted

## 2020-09-13 ENCOUNTER — Other Ambulatory Visit: Payer: Self-pay | Admitting: Sports Medicine

## 2020-09-13 LAB — WOUND CULTURE

## 2020-09-13 MED ORDER — SULFAMETHOXAZOLE-TRIMETHOPRIM 400-80 MG PO TABS: 1.0000 | ORAL_TABLET | Freq: Two times a day (BID) | ORAL | 0 refills | Status: DC

## 2020-09-13 NOTE — Progress Notes (Signed)
Sent bactrim for + wound culture

## 2020-09-13 NOTE — Telephone Encounter (Signed)
-----   Message from Landis Martins, [place] sent at 09/13/2020  7:24 AM EST ----- Will you let patient know that I sent Bactrim antibiotic to his pharmacy for him to pick up for + wound culture Thanks Dr, Chauncey Cruel

## 2020-09-13 NOTE — Telephone Encounter (Signed)
Called and spoke with the patient and relayed the message per Dr Stover. Francisco Howe °

## 2020-09-17 ENCOUNTER — Encounter: Payer: Self-pay | Admitting: Sports Medicine

## 2020-09-17 ENCOUNTER — Ambulatory Visit (INDEPENDENT_AMBULATORY_CARE_PROVIDER_SITE_OTHER): Payer: Medicare HMO | Admitting: Sports Medicine

## 2020-09-17 ENCOUNTER — Other Ambulatory Visit: Payer: Self-pay

## 2020-09-17 DIAGNOSIS — M21619 Bunion of unspecified foot: Secondary | ICD-10-CM

## 2020-09-17 DIAGNOSIS — E0842 Diabetes mellitus due to underlying condition with diabetic polyneuropathy: Secondary | ICD-10-CM

## 2020-09-17 DIAGNOSIS — L97511 Non-pressure chronic ulcer of other part of right foot limited to breakdown of skin: Secondary | ICD-10-CM | POA: Diagnosis not present

## 2020-09-17 DIAGNOSIS — I739 Peripheral vascular disease, unspecified: Secondary | ICD-10-CM

## 2020-09-17 NOTE — Progress Notes (Signed)
Subjective: Francisco Howe is a 75 y.o. male patient seen in office for follow up evaluation of ulceration of the right 1st toe. Patient has a history of diabetes and a blood glucose level today that has not been recorded and patient reports that he is taking Bactrim with no issues, denies drainage from the toe, denies nausea/fever/vomiting/chills/night sweats/shortness of breath, no other pedal complaints at this time.  Patient Active Problem List   Diagnosis Date Noted  . Abdominal aortic aneurysm ([place]) 09/10/2020  . Abdominal aortic atherosclerosis ([place]) 09/10/2020  . Aortic valve stenosis 09/10/2020  . Bilateral pseudophakia 09/10/2020  . Chronic bronchitis ([place]) 09/10/2020  . Degenerative joint disease of hand 09/10/2020  . Diabetic neuropathy ([place]) 09/10/2020  . Diastolic dysfunction 82/50/5397  . Ex-smoker 09/10/2020  . Hand pain 09/10/2020  . Hypertrophy of prostate without urinary obstruction and other lower urinary tract symptoms (LUTS) 09/10/2020  . Left ventricular hypertrophy 09/10/2020  . Microalbuminuria 09/10/2020  . Inflammatory polyarthropathy ([place]) 09/10/2020  . Other osteoporosis 09/10/2020  . Psychosexual dysfunction with inhibited sexual excitement 09/10/2020  . Umbilical hernia 67/34/1937  . Vitamin D deficiency 09/10/2020  . Pain in right foot 08/30/2018  . Chest pain 05/26/2016  . Hx of CABG 05/26/2016  . Obese 02/28/2014  . Postoperative anemia due to acute blood loss 02/28/2014  . Hyponatremia 02/28/2014  . S/P right TKA 02/27/2014  . Folliculitis 90/24/0973  . Oral lichen planus 53/29/9242  . Diabetes mellitus ([place]) 01/29/2012  . PURE HYPERCHOLESTEROLEMIA 08/01/2010  . HYPERLIPIDEMIA 08/01/2009  . GOUT 08/01/2009  . HYPERTENSION 08/01/2009  . CAD 08/01/2009  . CEREBROVASCULAR DISEASE 08/01/2009  . Tubular adenoma of colon 07/27/2005   Current Outpatient Medications on File Prior to Visit  Medication Sig Dispense Refill  . allopurinol (ZYLOPRIM)  300 MG tablet Take 300 mg by mouth daily.    Marland Kitchen amLODipine (NORVASC) 10 MG tablet Take 10 mg by mouth every morning.     . Calcium-Magnesium-Vitamin D (CALCIUM 1200+D3 PO) Take 1 tablet by mouth daily.    . cholecalciferol (VITAMIN D) 1000 UNITS tablet Take 1,000 Units by mouth daily.    . ciprofloxacin-dexamethasone (CIPRODEX) OTIC suspension Place 4 drops into the right ear 2 (two) times daily.    . Cyanocobalamin (VITAMIN B-12) 5000 MCG TBDP Take 5,000 mcg by mouth every Monday, Wednesday, and Friday.    . docusate sodium 100 MG CAPS Take 100 mg by mouth 2 (two) times daily. 10 capsule 0  . doxycycline (VIBRAMYCIN) 100 MG capsule doxycycline hyclate 100 mg capsule    . ezetimibe (ZETIA) 10 MG tablet Take 0.5 tablets by mouth daily.    Marland Kitchen ezetimibe-simvastatin (VYTORIN) 10-40 MG tablet Take 1 tablet by mouth daily.    Marland Kitchen HYDROcodone-acetaminophen (NORCO) 7.5-325 MG per tablet Take 1-2 tablets by mouth every 4 (four) hours as needed for moderate pain. 100 tablet 0  . insulin aspart (NOVOLOG) 100 UNIT/ML FlexPen INJECT 48 UNITS SUBCUTANEOUSLY ONCE A DAY WITH SUPPER AS DIRECTED FOR DIABETES.    Marland Kitchen insulin glargine (LANTUS) 100 UNIT/ML injection Inject 80 Units into the skin daily.    Marland Kitchen losartan (COZAAR) 100 MG tablet Take 50 mg by mouth daily.     . metFORMIN (GLUCOPHAGE) 1000 MG tablet Take 500-1,000 mg by mouth See admin instructions. Take 1000 mg by mouth in the morning and 500 mg at bedtime    . metoprolol tartrate (LOPRESSOR) 25 MG tablet Take 12.5 mg by mouth 2 (two) times daily.     Marland Kitchen  nitroGLYCERIN (NITROSTAT) 0.4 MG SL tablet Place 0.4 mg under the tongue every 5 (five) minutes as needed for chest pain.    . Omega-3 Fatty Acids (FISH OIL) 1200 MG CAPS Take 1,200 mg by mouth daily.    Marland Kitchen oxyCODONE (OXY IR/ROXICODONE) 5 MG immediate release tablet Take 1 tablet (5 mg total) by mouth every 6 (six) hours as needed for moderate pain, severe pain or breakthrough pain. 12 tablet 0  . polyethylene  glycol (MIRALAX / GLYCOLAX) packet Take 17 g by mouth 2 (two) times daily. 14 each 0  . Semaglutide, 1 MG/DOSE, (OZEMPIC, 1 MG/DOSE,) 2 MG/1.5ML SOPN Inject 1 mg into the skin once a week.    . sildenafil (VIAGRA) 100 MG tablet Take 1 tablet by mouth as needed.    . sulfamethoxazole-trimethoprim (BACTRIM) 400-80 MG tablet Take 1 tablet by mouth 2 (two) times daily. 28 tablet 0  . tacrolimus (PROGRAF) 1 MG capsule Take 1 mg by mouth 2 (two) times daily. Open capsule and mix with water and swish and spit 20 mls twice daily Pt was using for lichen planus of gums -  No longer has the lichen planus - but has the medication to use again if needed     No current facility-administered medications on file prior to visit.   Allergies  Allergen Reactions  . Lisinopril   . Statins     Joint pain  . Vytorin [Ezetimibe-Simvastatin]     unknown    Recent Results (from the past 2160 hour(s))  WOUND CULTURE     Status: Abnormal   Collection Time: 09/10/20  4:01 PM   Specimen: Foot, Right; Wound   Wound Culture and sens  Result Value Ref Range   Gram Stain Result Final report    Organism ID, Bacteria Comment     Comment: No white blood cells seen.   Organism ID, Bacteria Comment     Comment: Few gram positive cocci   Aerobic Bacterial Culture Final report (A)    Organism ID, Bacteria Staphylococcus aureus (A)     Comment: Heavy growth Most isolates of Staphylococcus sp. produce a beta-lactamase enzyme rendering them resistant to penicillin. Please contact the laboratory if penicillin is being considered for therapy.    Antimicrobial Susceptibility Comment     Comment:       ** S = Susceptible; I = Intermediate; R = Resistant **                    P = Positive; N = Negative             MICS are expressed in micrograms per mL    Antibiotic                 RSLT#1    RSLT#2    RSLT#3    RSLT#4 Ciprofloxacin                  S Clindamycin                    S Erythromycin                    S Gentamicin                     S Levofloxacin                   S Linezolid  S Moxifloxacin                   S Oxacillin                      S Quinupristin/Dalfopristin      S Rifampin                       S Tetracycline                   S Trimethoprim/Sulfa             S Vancomycin                     S     Objective: There were no vitals filed for this visit.  General: Patient is awake, alert, oriented x 3 and in no acute distress.  Dermatology: Skin is warm and dry bilateral with a now healed ulceration present  Right 1st toe medial aspect with a small amount of dry heme to area. There is no malodor, no active drainage, no erythema, no edema. No acute signs of infection.   Vascular: Dorsalis Pedis pulse = 1/4 Bilateral,  Posterior Tibial pulse = 1/4 Bilateral,  Capillary Fill Time < 5 seconds  Neurologic: Protective sensation absent bilateral using the 5.07/10g Semmes Weinstein Monofilament.  Musculosketal: + Bunion and hammertoe bony deformities noted bilateral.   No results for input(s): GRAMSTAIN, LABORGA in the last 8760 hours.  Assessment and Plan:  Problem List Items Addressed This Visit      Endocrine   Diabetic neuropathy ([place])    Other Visit Diagnoses    Right second toe ulcer, limited to breakdown of skin ([place])    -  Primary   prematurely healed   Bunion       PVD (peripheral vascular disease) ([place])          -Examined patient and discussed the progression of the wound and treatment alternatives. - Excisionally dedbrided right great toe using a 15 blade, there was no underlying opening. -Applieddry sterile dressing and instructed patient to continue with daily dressings at home consisting of the same -Continue with shoes that do not rub toes like previous - Advised patient to go to the ER or return to office if the wound worsens or if constitutional symptoms are present. -Patient to return to office in 3 weeks for follow up wound  care or sooner if problems arise.   Landis Martins, DPM

## 2020-09-25 ENCOUNTER — Ambulatory Visit: Payer: Medicare HMO | Admitting: Sports Medicine

## 2020-10-08 ENCOUNTER — Ambulatory Visit (INDEPENDENT_AMBULATORY_CARE_PROVIDER_SITE_OTHER): Payer: Medicare HMO | Admitting: Sports Medicine

## 2020-10-08 ENCOUNTER — Encounter: Payer: Self-pay | Admitting: Sports Medicine

## 2020-10-08 ENCOUNTER — Other Ambulatory Visit: Payer: Self-pay

## 2020-10-08 DIAGNOSIS — I739 Peripheral vascular disease, unspecified: Secondary | ICD-10-CM

## 2020-10-08 DIAGNOSIS — E0842 Diabetes mellitus due to underlying condition with diabetic polyneuropathy: Secondary | ICD-10-CM | POA: Diagnosis not present

## 2020-10-08 DIAGNOSIS — M21619 Bunion of unspecified foot: Secondary | ICD-10-CM | POA: Diagnosis not present

## 2020-10-08 DIAGNOSIS — L97519 Non-pressure chronic ulcer of other part of right foot with unspecified severity: Secondary | ICD-10-CM | POA: Diagnosis not present

## 2020-10-08 NOTE — Progress Notes (Signed)
Subjective: Francisco Howe is a 75 y.o. male patient seen in office for follow up evaluation of ulceration of the right 1st toe. Patient has a history of diabetes and a blood glucose level today that has not been recorded because having meter issues. Reports that it looks better but denies nausea/fever/vomiting/chills/night sweats/shortness of breath, no other pedal complaints at this time.  Patient Active Problem List   Diagnosis Date Noted  . Abdominal aortic aneurysm ([place]) 09/10/2020  . Abdominal aortic atherosclerosis ([place]) 09/10/2020  . Aortic valve stenosis 09/10/2020  . Bilateral pseudophakia 09/10/2020  . Chronic bronchitis ([place]) 09/10/2020  . Degenerative joint disease of hand 09/10/2020  . Diabetic neuropathy ([place]) 09/10/2020  . Diastolic dysfunction 69/62/9528  . Ex-smoker 09/10/2020  . Hand pain 09/10/2020  . Hypertrophy of prostate without urinary obstruction and other lower urinary tract symptoms (LUTS) 09/10/2020  . Left ventricular hypertrophy 09/10/2020  . Microalbuminuria 09/10/2020  . Inflammatory polyarthropathy ([place]) 09/10/2020  . Other osteoporosis 09/10/2020  . Psychosexual dysfunction with inhibited sexual excitement 09/10/2020  . Umbilical hernia 41/32/4401  . Vitamin D deficiency 09/10/2020  . Pain in right foot 08/30/2018  . Chest pain 05/26/2016  . Hx of CABG 05/26/2016  . Obese 02/28/2014  . Postoperative anemia due to acute blood loss 02/28/2014  . Hyponatremia 02/28/2014  . S/P right TKA 02/27/2014  . Folliculitis 02/72/5366  . Oral lichen planus 44/09/4740  . Diabetes mellitus ([place]) 01/29/2012  . PURE HYPERCHOLESTEROLEMIA 08/01/2010  . HYPERLIPIDEMIA 08/01/2009  . GOUT 08/01/2009  . HYPERTENSION 08/01/2009  . CAD 08/01/2009  . CEREBROVASCULAR DISEASE 08/01/2009  . Tubular adenoma of colon 07/27/2005   Current Outpatient Medications on File Prior to Visit  Medication Sig Dispense Refill  . allopurinol (ZYLOPRIM) 300 MG tablet Take 300 mg by  mouth daily.    Marland Kitchen amLODipine (NORVASC) 10 MG tablet Take 10 mg by mouth every morning.     . Calcium-Magnesium-Vitamin D (CALCIUM 1200+D3 PO) Take 1 tablet by mouth daily.    . cholecalciferol (VITAMIN D) 1000 UNITS tablet Take 1,000 Units by mouth daily.    . ciprofloxacin-dexamethasone (CIPRODEX) OTIC suspension Place 4 drops into the right ear 2 (two) times daily.    . Cyanocobalamin (VITAMIN B-12) 5000 MCG TBDP Take 5,000 mcg by mouth every Monday, Wednesday, and Friday.    . docusate sodium 100 MG CAPS Take 100 mg by mouth 2 (two) times daily. 10 capsule 0  . doxycycline (VIBRAMYCIN) 100 MG capsule doxycycline hyclate 100 mg capsule    . ezetimibe (ZETIA) 10 MG tablet Take 0.5 tablets by mouth daily.    Marland Kitchen ezetimibe-simvastatin (VYTORIN) 10-40 MG tablet Take 1 tablet by mouth daily.    Marland Kitchen HYDROcodone-acetaminophen (NORCO) 7.5-325 MG per tablet Take 1-2 tablets by mouth every 4 (four) hours as needed for moderate pain. 100 tablet 0  . insulin aspart (NOVOLOG) 100 UNIT/ML FlexPen INJECT 48 UNITS SUBCUTANEOUSLY ONCE A DAY WITH SUPPER AS DIRECTED FOR DIABETES.    Marland Kitchen insulin glargine (LANTUS) 100 UNIT/ML injection Inject 80 Units into the skin daily.    Marland Kitchen losartan (COZAAR) 100 MG tablet Take 50 mg by mouth daily.     . metFORMIN (GLUCOPHAGE) 1000 MG tablet Take 500-1,000 mg by mouth See admin instructions. Take 1000 mg by mouth in the morning and 500 mg at bedtime    . metoprolol tartrate (LOPRESSOR) 25 MG tablet Take 12.5 mg by mouth 2 (two) times daily.     . nitroGLYCERIN (NITROSTAT) 0.4 MG SL tablet  Place 0.4 mg under the tongue every 5 (five) minutes as needed for chest pain.    . Omega-3 Fatty Acids (FISH OIL) 1200 MG CAPS Take 1,200 mg by mouth daily.    Marland Kitchen oxyCODONE (OXY IR/ROXICODONE) 5 MG immediate release tablet Take 1 tablet (5 mg total) by mouth every 6 (six) hours as needed for moderate pain, severe pain or breakthrough pain. 12 tablet 0  . polyethylene glycol (MIRALAX / GLYCOLAX) packet  Take 17 g by mouth 2 (two) times daily. 14 each 0  . Semaglutide, 1 MG/DOSE, (OZEMPIC, 1 MG/DOSE,) 2 MG/1.5ML SOPN Inject 1 mg into the skin once a week.    . sildenafil (VIAGRA) 100 MG tablet Take 1 tablet by mouth as needed.    . sulfamethoxazole-trimethoprim (BACTRIM) 400-80 MG tablet Take 1 tablet by mouth 2 (two) times daily. 28 tablet 0  . tacrolimus (PROGRAF) 1 MG capsule Take 1 mg by mouth 2 (two) times daily. Open capsule and mix with water and swish and spit 20 mls twice daily Pt was using for lichen planus of gums -  No longer has the lichen planus - but has the medication to use again if needed     No current facility-administered medications on file prior to visit.   Allergies  Allergen Reactions  . Lisinopril   . Statins     Joint pain  . Vytorin [Ezetimibe-Simvastatin]     unknown    Recent Results (from the past 2160 hour(s))  WOUND CULTURE     Status: Abnormal   Collection Time: 09/10/20  4:01 PM   Specimen: Foot, Right; Wound   Wound Culture and sens  Result Value Ref Range   Gram Stain Result Final report    Organism ID, Bacteria Comment     Comment: No white blood cells seen.   Organism ID, Bacteria Comment     Comment: Few gram positive cocci   Aerobic Bacterial Culture Final report (A)    Organism ID, Bacteria Staphylococcus aureus (A)     Comment: Heavy growth Most isolates of Staphylococcus sp. produce a beta-lactamase enzyme rendering them resistant to penicillin. Please contact the laboratory if penicillin is being considered for therapy.    Antimicrobial Susceptibility Comment     Comment:       ** S = Susceptible; I = Intermediate; R = Resistant **                    P = Positive; N = Negative             MICS are expressed in micrograms per mL    Antibiotic                 RSLT#1    RSLT#2    RSLT#3    RSLT#4 Ciprofloxacin                  S Clindamycin                    S Erythromycin                   S Gentamicin                      S Levofloxacin                   S Linezolid  S Moxifloxacin                   S Oxacillin                      S Quinupristin/Dalfopristin      S Rifampin                       S Tetracycline                   S Trimethoprim/Sulfa             S Vancomycin                     S     Objective: There were no vitals filed for this visit.  General: Patient is awake, alert, oriented x 3 and in no acute distress.  Dermatology: Skin is warm and dry bilateral with a now healed ulceration present  Right 1st toe medial aspect with a small amount of dry heme to area with no underlying opening. There is no malodor, no active drainage, no erythema, no edema. No acute signs of infection.   Vascular: Dorsalis Pedis pulse = 1/4 Bilateral,  Posterior Tibial pulse = 1/4 Bilateral,  Capillary Fill Time < 5 seconds  Neurologic: Protective sensation absent bilateral using the 5.07/10g Semmes Weinstein Monofilament.  Musculosketal: + Bunion and hammertoe bony deformities noted bilateral.   No results for input(s): GRAMSTAIN, LABORGA in the last 8760 hours.  Assessment and Plan:  Problem List Items Addressed This Visit      Endocrine   Diabetic neuropathy ([place])    Other Visit Diagnoses    Ulcer of toe of right foot, unspecified ulcer stage ([place])    -  Primary   healed   Bunion       PVD (peripheral vascular disease) ([place])          -Examined patient and discussed the progression of the wound and treatment alternatives. - Excisionally dedbrided right great toe using a 15 blade, there was no underlying opening.  Previous ulceration remains healed. -Offered patient a toe cushion but he declined states that he will not use it -Continue with shoes that do not rub toes like previous - Advised patient to go to the ER or return to office if the wound worsens or if constitutional symptoms are present. -Patient to return to office if fails to continue to improve or if things worsen  or sooner if problems arise.   Landis Martins, DPM

## 2021-05-27 DIAGNOSIS — J029 Acute pharyngitis, unspecified: Secondary | ICD-10-CM | POA: Diagnosis not present

## 2021-05-27 DIAGNOSIS — J069 Acute upper respiratory infection, unspecified: Secondary | ICD-10-CM | POA: Diagnosis not present

## 2021-05-27 DIAGNOSIS — R051 Acute cough: Secondary | ICD-10-CM | POA: Diagnosis not present

## 2021-05-29 ENCOUNTER — Other Ambulatory Visit (HOSPITAL_COMMUNITY): Payer: Self-pay | Admitting: Cardiovascular Disease

## 2021-05-29 DIAGNOSIS — Z8249 Family history of ischemic heart disease and other diseases of the circulatory system: Secondary | ICD-10-CM

## 2021-05-29 DIAGNOSIS — R0600 Dyspnea, unspecified: Secondary | ICD-10-CM

## 2021-06-09 ENCOUNTER — Telehealth (HOSPITAL_COMMUNITY): Payer: Self-pay | Admitting: Emergency Medicine

## 2021-06-09 NOTE — Telephone Encounter (Signed)
Reaching out to patient to offer assistance regarding upcoming cardiac imaging study; pt verbalizes understanding of appt date/time, parking situation and where to check in, pre-test NPO status and medications ordered, and verified current allergies; name and call back number provided for further questions should they arise Marchia Bond RN Navigator Cardiac Imaging [facility] [phone] office [phone] cell  Pt getting CCT[facility] to [facility] Pt needs new labs, attempted to call [facility] to see what our options are to get him labs that would be covered under his benefits. Awaiting a call back from [place]

## 2021-06-09 NOTE — Telephone Encounter (Signed)
Calling to inform that after my attempts to call the [place] [facility]- no one returned my calls/messages- that we will need to collect an istat creat on the him prior to contrast injection to check his renal function. Pt verbalized understanding.  Marchia Bond RN Navigator Cardiac Imaging [facility] [phone] Office  [phone] Cell

## 2021-06-10 ENCOUNTER — Other Ambulatory Visit: Payer: Self-pay

## 2021-06-10 ENCOUNTER — Ambulatory Visit (HOSPITAL_COMMUNITY)
Admission: RE | Admit: 2021-06-10 | Discharge: 2021-06-10 | Disposition: A | Discharge status: Home / Self Care | Payer: No Typology Code available for payment source | Source: Ambulatory Visit | Attending: Cardiovascular Disease | Admitting: Cardiovascular Disease

## 2021-06-10 DIAGNOSIS — Z8249 Family history of ischemic heart disease and other diseases of the circulatory system: Secondary | ICD-10-CM

## 2021-06-10 DIAGNOSIS — R0600 Dyspnea, unspecified: Secondary | ICD-10-CM

## 2021-06-10 LAB — POCT I-STAT CREATININE: Creatinine, Ser: 0.7 mg/dL (ref 0.61–1.24)

## 2021-06-10 MED ORDER — NITROGLYCERIN 0.4 MG SL SUBL: 0.8000 mg | SUBLINGUAL_TABLET | Freq: Once | SUBLINGUAL | Status: AC

## 2021-06-10 MED ORDER — METOPROLOL TARTRATE 5 MG/5ML IV SOLN
INTRAVENOUS | Status: AC
  Filled 2021-06-10: qty 10

## 2021-06-10 MED ORDER — DILTIAZEM HCL 25 MG/5ML IV SOLN
INTRAVENOUS | Status: AC
  Administered 2021-06-10: 10 mg via INTRAVENOUS
  Filled 2021-06-10: qty 5

## 2021-06-10 MED ORDER — METOPROLOL TARTRATE 5 MG/5ML IV SOLN
10.0000 mg | INTRAVENOUS | Status: DC | PRN
  Administered 2021-06-10: 10 mg via INTRAVENOUS

## 2021-06-10 MED ORDER — NITROGLYCERIN 0.4 MG SL SUBL
SUBLINGUAL_TABLET | SUBLINGUAL | Status: AC
  Administered 2021-06-10: 0.8 mg via SUBLINGUAL
  Filled 2021-06-10: qty 2

## 2021-06-10 MED ORDER — METOPROLOL TARTRATE 5 MG/5ML IV SOLN
INTRAVENOUS | Status: AC
  Administered 2021-06-10: 10 mg via INTRAVENOUS
  Filled 2021-06-10: qty 20

## 2021-06-10 MED ORDER — METOPROLOL TARTRATE 5 MG/5ML IV SOLN
INTRAVENOUS | Status: AC
  Administered 2021-06-10: 10 mg via INTRAVENOUS
  Filled 2021-06-10: qty 5

## 2021-06-10 MED ORDER — DILTIAZEM HCL 25 MG/5ML IV SOLN: 10.0000 mg | INTRAVENOUS | Status: DC | PRN

## 2021-06-10 MED ORDER — IOHEXOL 350 MG/ML SOLN
95.0000 mL | Freq: Once | INTRAVENOUS | Status: AC | PRN
  Administered 2021-06-10: 95 mL via INTRAVENOUS

## 2021-06-11 ENCOUNTER — Other Ambulatory Visit (HOSPITAL_COMMUNITY): Payer: Self-pay | Admitting: Emergency Medicine

## 2021-06-11 DIAGNOSIS — R079 Chest pain, unspecified: Secondary | ICD-10-CM

## 2021-06-11 NOTE — Progress Notes (Signed)
CCTA order placed per [place] ([place])   Marchia Bond RN Navigator Cardiac Imaging [facility] [phone] Office  [phone] Cell

## 2021-06-24 ENCOUNTER — Encounter (HOSPITAL_COMMUNITY): Payer: Self-pay

## 2021-06-24 ENCOUNTER — Other Ambulatory Visit: Payer: Self-pay

## 2021-06-24 ENCOUNTER — Ambulatory Visit (HOSPITAL_COMMUNITY)
Admission: RE | Admit: 2021-06-24 | Discharge: 2021-06-24 | Disposition: A | Discharge status: Home / Self Care | Payer: No Typology Code available for payment source | Source: Ambulatory Visit | Attending: Cardiovascular Disease | Admitting: Cardiovascular Disease

## 2021-06-24 DIAGNOSIS — R079 Chest pain, unspecified: Secondary | ICD-10-CM | POA: Diagnosis present

## 2021-06-24 MED ORDER — NITROGLYCERIN 0.4 MG SL SUBL: 0.8000 mg | SUBLINGUAL_TABLET | Freq: Once | SUBLINGUAL | Status: DC

## 2021-06-24 MED ORDER — DILTIAZEM HCL 25 MG/5ML IV SOLN
5.0000 mg | INTRAVENOUS | Status: DC | PRN
  Administered 2021-06-24: 10 mg via INTRAVENOUS

## 2021-06-24 MED ORDER — METOPROLOL TARTRATE 5 MG/5ML IV SOLN
5.0000 mg | INTRAVENOUS | Status: DC | PRN
  Administered 2021-06-24 (×2): 10 mg via INTRAVENOUS

## 2021-06-24 NOTE — Progress Notes (Signed)
Attempted to complete CCTA today. The patients heart rate began in the 90's without response to 20 mg of lopressor. 10 mg of diltiazem given resulting in a heart rate between 84-79. When patient breath holds heart rate responded however induced coughing spells causing tachycardia. Dr. Harriet Masson was consulted. It was deemed to cancel the test at this time. Patient and spouse made aware of plan, they have no further questions at this time.

## 2021-09-13 DIAGNOSIS — R0902 Hypoxemia: Secondary | ICD-10-CM | POA: Diagnosis not present

## 2021-09-13 DIAGNOSIS — R110 Nausea: Secondary | ICD-10-CM | POA: Diagnosis not present

## 2021-09-13 DIAGNOSIS — M6281 Muscle weakness (generalized): Secondary | ICD-10-CM | POA: Diagnosis not present

## 2021-09-13 DIAGNOSIS — M47814 Spondylosis without myelopathy or radiculopathy, thoracic region: Secondary | ICD-10-CM | POA: Diagnosis not present

## 2021-09-13 DIAGNOSIS — M47816 Spondylosis without myelopathy or radiculopathy, lumbar region: Secondary | ICD-10-CM | POA: Diagnosis not present

## 2021-09-13 DIAGNOSIS — Z792 Long term (current) use of antibiotics: Secondary | ICD-10-CM | POA: Diagnosis not present

## 2021-09-13 DIAGNOSIS — Z23 Encounter for immunization: Secondary | ICD-10-CM | POA: Diagnosis not present

## 2021-09-13 DIAGNOSIS — I6381 Other cerebral infarction due to occlusion or stenosis of small artery: Secondary | ICD-10-CM | POA: Diagnosis not present

## 2021-09-13 DIAGNOSIS — R4182 Altered mental status, unspecified: Secondary | ICD-10-CM | POA: Diagnosis not present

## 2021-09-13 DIAGNOSIS — J9811 Atelectasis: Secondary | ICD-10-CM | POA: Diagnosis not present

## 2021-09-13 DIAGNOSIS — Z20822 Contact with and (suspected) exposure to covid-19: Secondary | ICD-10-CM | POA: Diagnosis not present

## 2021-09-13 DIAGNOSIS — N400 Enlarged prostate without lower urinary tract symptoms: Secondary | ICD-10-CM | POA: Diagnosis not present

## 2021-09-13 DIAGNOSIS — R0602 Shortness of breath: Secondary | ICD-10-CM | POA: Diagnosis not present

## 2021-09-13 DIAGNOSIS — I499 Cardiac arrhythmia, unspecified: Secondary | ICD-10-CM | POA: Diagnosis not present

## 2021-09-13 DIAGNOSIS — R410 Disorientation, unspecified: Secondary | ICD-10-CM | POA: Diagnosis not present

## 2021-09-13 DIAGNOSIS — E872 Acidosis, unspecified: Secondary | ICD-10-CM | POA: Diagnosis not present

## 2021-09-13 DIAGNOSIS — R531 Weakness: Secondary | ICD-10-CM | POA: Diagnosis not present

## 2021-09-13 DIAGNOSIS — K7460 Unspecified cirrhosis of liver: Secondary | ICD-10-CM | POA: Diagnosis not present

## 2021-09-13 DIAGNOSIS — R0682 Tachypnea, not elsewhere classified: Secondary | ICD-10-CM | POA: Diagnosis not present

## 2021-09-13 DIAGNOSIS — K5730 Diverticulosis of large intestine without perforation or abscess without bleeding: Secondary | ICD-10-CM | POA: Diagnosis not present

## 2021-09-13 DIAGNOSIS — Z743 Need for continuous supervision: Secondary | ICD-10-CM | POA: Diagnosis not present

## 2021-09-13 DIAGNOSIS — Z7982 Long term (current) use of aspirin: Secondary | ICD-10-CM | POA: Diagnosis not present

## 2021-09-13 DIAGNOSIS — A419 Sepsis, unspecified organism: Secondary | ICD-10-CM | POA: Diagnosis not present

## 2021-09-13 DIAGNOSIS — G9341 Metabolic encephalopathy: Secondary | ICD-10-CM | POA: Diagnosis not present

## 2021-09-13 DIAGNOSIS — R739 Hyperglycemia, unspecified: Secondary | ICD-10-CM | POA: Diagnosis not present

## 2021-09-13 DIAGNOSIS — E119 Type 2 diabetes mellitus without complications: Secondary | ICD-10-CM | POA: Diagnosis not present

## 2021-09-13 DIAGNOSIS — R0600 Dyspnea, unspecified: Secondary | ICD-10-CM | POA: Diagnosis not present

## 2021-09-13 DIAGNOSIS — J189 Pneumonia, unspecified organism: Secondary | ICD-10-CM | POA: Diagnosis not present

## 2021-09-13 DIAGNOSIS — E860 Dehydration: Secondary | ICD-10-CM | POA: Diagnosis not present

## 2021-09-13 DIAGNOSIS — I4510 Unspecified right bundle-branch block: Secondary | ICD-10-CM | POA: Diagnosis not present

## 2021-09-13 DIAGNOSIS — I2510 Atherosclerotic heart disease of native coronary artery without angina pectoris: Secondary | ICD-10-CM | POA: Diagnosis not present

## 2021-09-14 DIAGNOSIS — R0602 Shortness of breath: Secondary | ICD-10-CM | POA: Diagnosis not present

## 2021-09-14 DIAGNOSIS — M47816 Spondylosis without myelopathy or radiculopathy, lumbar region: Secondary | ICD-10-CM | POA: Diagnosis not present

## 2021-09-14 DIAGNOSIS — K5730 Diverticulosis of large intestine without perforation or abscess without bleeding: Secondary | ICD-10-CM | POA: Diagnosis not present

## 2021-09-14 DIAGNOSIS — R069 Unspecified abnormalities of breathing: Secondary | ICD-10-CM | POA: Diagnosis not present

## 2021-09-14 DIAGNOSIS — G9341 Metabolic encephalopathy: Secondary | ICD-10-CM | POA: Diagnosis not present

## 2021-09-14 DIAGNOSIS — M6281 Muscle weakness (generalized): Secondary | ICD-10-CM | POA: Diagnosis not present

## 2021-09-14 DIAGNOSIS — Z794 Long term (current) use of insulin: Secondary | ICD-10-CM | POA: Diagnosis not present

## 2021-09-14 DIAGNOSIS — A419 Sepsis, unspecified organism: Secondary | ICD-10-CM | POA: Diagnosis not present

## 2021-09-14 DIAGNOSIS — E871 Hypo-osmolality and hyponatremia: Secondary | ICD-10-CM | POA: Diagnosis not present

## 2021-09-14 DIAGNOSIS — M1990 Unspecified osteoarthritis, unspecified site: Secondary | ICD-10-CM | POA: Diagnosis not present

## 2021-09-14 DIAGNOSIS — N400 Enlarged prostate without lower urinary tract symptoms: Secondary | ICD-10-CM | POA: Diagnosis not present

## 2021-09-14 DIAGNOSIS — J9811 Atelectasis: Secondary | ICD-10-CM | POA: Diagnosis not present

## 2021-09-14 DIAGNOSIS — Z8673 Personal history of transient ischemic attack (TIA), and cerebral infarction without residual deficits: Secondary | ICD-10-CM | POA: Diagnosis not present

## 2021-09-14 DIAGNOSIS — R0902 Hypoxemia: Secondary | ICD-10-CM | POA: Diagnosis not present

## 2021-09-14 DIAGNOSIS — Z87891 Personal history of nicotine dependence: Secondary | ICD-10-CM | POA: Diagnosis not present

## 2021-09-14 DIAGNOSIS — M47814 Spondylosis without myelopathy or radiculopathy, thoracic region: Secondary | ICD-10-CM | POA: Diagnosis not present

## 2021-09-14 DIAGNOSIS — M109 Gout, unspecified: Secondary | ICD-10-CM | POA: Diagnosis not present

## 2021-09-14 DIAGNOSIS — Z79899 Other long term (current) drug therapy: Secondary | ICD-10-CM | POA: Diagnosis not present

## 2021-09-14 DIAGNOSIS — I2510 Atherosclerotic heart disease of native coronary artery without angina pectoris: Secondary | ICD-10-CM | POA: Diagnosis not present

## 2021-09-14 DIAGNOSIS — R531 Weakness: Secondary | ICD-10-CM | POA: Diagnosis not present

## 2021-09-14 DIAGNOSIS — E860 Dehydration: Secondary | ICD-10-CM | POA: Diagnosis not present

## 2021-09-14 DIAGNOSIS — R0682 Tachypnea, not elsewhere classified: Secondary | ICD-10-CM | POA: Diagnosis not present

## 2021-09-14 DIAGNOSIS — L439 Lichen planus, unspecified: Secondary | ICD-10-CM | POA: Diagnosis not present

## 2021-09-14 DIAGNOSIS — I252 Old myocardial infarction: Secondary | ICD-10-CM | POA: Diagnosis not present

## 2021-09-14 DIAGNOSIS — R4182 Altered mental status, unspecified: Secondary | ICD-10-CM | POA: Diagnosis not present

## 2021-09-14 DIAGNOSIS — I6381 Other cerebral infarction due to occlusion or stenosis of small artery: Secondary | ICD-10-CM | POA: Diagnosis not present

## 2021-09-14 DIAGNOSIS — E1165 Type 2 diabetes mellitus with hyperglycemia: Secondary | ICD-10-CM | POA: Diagnosis not present

## 2021-09-14 DIAGNOSIS — R0600 Dyspnea, unspecified: Secondary | ICD-10-CM | POA: Diagnosis not present

## 2021-09-14 DIAGNOSIS — R000 Tachycardia, unspecified: Secondary | ICD-10-CM | POA: Diagnosis not present

## 2021-09-14 DIAGNOSIS — R509 Fever, unspecified: Secondary | ICD-10-CM | POA: Diagnosis not present

## 2021-09-14 DIAGNOSIS — K7460 Unspecified cirrhosis of liver: Secondary | ICD-10-CM | POA: Diagnosis not present

## 2021-09-14 DIAGNOSIS — E872 Acidosis, unspecified: Secondary | ICD-10-CM | POA: Diagnosis not present

## 2021-09-14 DIAGNOSIS — I361 Nonrheumatic tricuspid (valve) insufficiency: Secondary | ICD-10-CM | POA: Diagnosis not present

## 2021-09-14 DIAGNOSIS — L03114 Cellulitis of left upper limb: Secondary | ICD-10-CM | POA: Diagnosis not present

## 2021-09-15 DIAGNOSIS — G9341 Metabolic encephalopathy: Secondary | ICD-10-CM | POA: Diagnosis not present

## 2021-09-15 DIAGNOSIS — A419 Sepsis, unspecified organism: Secondary | ICD-10-CM | POA: Diagnosis not present

## 2021-09-15 DIAGNOSIS — E872 Acidosis, unspecified: Secondary | ICD-10-CM | POA: Diagnosis not present

## 2021-09-16 DIAGNOSIS — E872 Acidosis, unspecified: Secondary | ICD-10-CM | POA: Diagnosis not present

## 2021-09-16 DIAGNOSIS — A419 Sepsis, unspecified organism: Secondary | ICD-10-CM | POA: Diagnosis not present

## 2021-09-16 DIAGNOSIS — G9341 Metabolic encephalopathy: Secondary | ICD-10-CM | POA: Diagnosis not present

## 2021-09-17 DIAGNOSIS — I361 Nonrheumatic tricuspid (valve) insufficiency: Secondary | ICD-10-CM | POA: Diagnosis not present

## 2021-09-17 DIAGNOSIS — G9341 Metabolic encephalopathy: Secondary | ICD-10-CM | POA: Diagnosis not present

## 2021-09-17 DIAGNOSIS — I2510 Atherosclerotic heart disease of native coronary artery without angina pectoris: Secondary | ICD-10-CM | POA: Diagnosis not present

## 2021-09-17 DIAGNOSIS — E872 Acidosis, unspecified: Secondary | ICD-10-CM | POA: Diagnosis not present

## 2021-09-17 DIAGNOSIS — A419 Sepsis, unspecified organism: Secondary | ICD-10-CM | POA: Diagnosis not present

## 2021-09-17 DIAGNOSIS — R0902 Hypoxemia: Secondary | ICD-10-CM | POA: Diagnosis not present

## 2021-09-18 DIAGNOSIS — G9341 Metabolic encephalopathy: Secondary | ICD-10-CM | POA: Diagnosis not present

## 2021-09-18 DIAGNOSIS — E872 Acidosis, unspecified: Secondary | ICD-10-CM | POA: Diagnosis not present

## 2021-09-18 DIAGNOSIS — A419 Sepsis, unspecified organism: Secondary | ICD-10-CM | POA: Diagnosis not present

## 2021-09-19 DIAGNOSIS — E872 Acidosis, unspecified: Secondary | ICD-10-CM | POA: Diagnosis not present

## 2021-09-19 DIAGNOSIS — G9341 Metabolic encephalopathy: Secondary | ICD-10-CM | POA: Diagnosis not present

## 2021-09-19 DIAGNOSIS — A419 Sepsis, unspecified organism: Secondary | ICD-10-CM | POA: Diagnosis not present

## 2021-09-30 DIAGNOSIS — E119 Type 2 diabetes mellitus without complications: Secondary | ICD-10-CM | POA: Diagnosis not present

## 2021-09-30 DIAGNOSIS — I440 Atrioventricular block, first degree: Secondary | ICD-10-CM | POA: Diagnosis not present

## 2021-09-30 DIAGNOSIS — I252 Old myocardial infarction: Secondary | ICD-10-CM | POA: Diagnosis not present

## 2021-09-30 DIAGNOSIS — I209 Angina pectoris, unspecified: Secondary | ICD-10-CM | POA: Diagnosis not present

## 2021-09-30 DIAGNOSIS — I119 Hypertensive heart disease without heart failure: Secondary | ICD-10-CM | POA: Diagnosis not present

## 2021-10-01 DIAGNOSIS — I119 Hypertensive heart disease without heart failure: Secondary | ICD-10-CM | POA: Diagnosis not present

## 2021-10-01 DIAGNOSIS — I440 Atrioventricular block, first degree: Secondary | ICD-10-CM | POA: Diagnosis not present

## 2021-10-01 DIAGNOSIS — E119 Type 2 diabetes mellitus without complications: Secondary | ICD-10-CM | POA: Diagnosis not present

## 2021-10-01 DIAGNOSIS — I252 Old myocardial infarction: Secondary | ICD-10-CM | POA: Diagnosis not present

## 2021-10-01 DIAGNOSIS — I209 Angina pectoris, unspecified: Secondary | ICD-10-CM | POA: Diagnosis not present

## 2021-10-03 DIAGNOSIS — I252 Old myocardial infarction: Secondary | ICD-10-CM | POA: Diagnosis not present

## 2021-10-03 DIAGNOSIS — E119 Type 2 diabetes mellitus without complications: Secondary | ICD-10-CM | POA: Diagnosis not present

## 2021-10-03 DIAGNOSIS — I209 Angina pectoris, unspecified: Secondary | ICD-10-CM | POA: Diagnosis not present

## 2021-10-03 DIAGNOSIS — I119 Hypertensive heart disease without heart failure: Secondary | ICD-10-CM | POA: Diagnosis not present

## 2021-10-03 DIAGNOSIS — I440 Atrioventricular block, first degree: Secondary | ICD-10-CM | POA: Diagnosis not present

## 2021-10-06 DIAGNOSIS — I209 Angina pectoris, unspecified: Secondary | ICD-10-CM | POA: Diagnosis not present

## 2021-10-06 DIAGNOSIS — I119 Hypertensive heart disease without heart failure: Secondary | ICD-10-CM | POA: Diagnosis not present

## 2021-10-06 DIAGNOSIS — I440 Atrioventricular block, first degree: Secondary | ICD-10-CM | POA: Diagnosis not present

## 2021-10-06 DIAGNOSIS — I252 Old myocardial infarction: Secondary | ICD-10-CM | POA: Diagnosis not present

## 2021-10-06 DIAGNOSIS — E119 Type 2 diabetes mellitus without complications: Secondary | ICD-10-CM | POA: Diagnosis not present

## 2021-10-08 DIAGNOSIS — I252 Old myocardial infarction: Secondary | ICD-10-CM | POA: Diagnosis not present

## 2021-10-08 DIAGNOSIS — I440 Atrioventricular block, first degree: Secondary | ICD-10-CM | POA: Diagnosis not present

## 2021-10-08 DIAGNOSIS — I119 Hypertensive heart disease without heart failure: Secondary | ICD-10-CM | POA: Diagnosis not present

## 2021-10-08 DIAGNOSIS — E119 Type 2 diabetes mellitus without complications: Secondary | ICD-10-CM | POA: Diagnosis not present

## 2021-10-08 DIAGNOSIS — I209 Angina pectoris, unspecified: Secondary | ICD-10-CM | POA: Diagnosis not present

## 2021-10-10 DIAGNOSIS — I209 Angina pectoris, unspecified: Secondary | ICD-10-CM | POA: Diagnosis not present

## 2021-10-10 DIAGNOSIS — I440 Atrioventricular block, first degree: Secondary | ICD-10-CM | POA: Diagnosis not present

## 2021-10-10 DIAGNOSIS — I252 Old myocardial infarction: Secondary | ICD-10-CM | POA: Diagnosis not present

## 2021-10-10 DIAGNOSIS — I119 Hypertensive heart disease without heart failure: Secondary | ICD-10-CM | POA: Diagnosis not present

## 2021-10-10 DIAGNOSIS — E119 Type 2 diabetes mellitus without complications: Secondary | ICD-10-CM | POA: Diagnosis not present

## 2021-10-13 DIAGNOSIS — I440 Atrioventricular block, first degree: Secondary | ICD-10-CM | POA: Diagnosis not present

## 2021-10-13 DIAGNOSIS — I119 Hypertensive heart disease without heart failure: Secondary | ICD-10-CM | POA: Diagnosis not present

## 2021-10-13 DIAGNOSIS — E119 Type 2 diabetes mellitus without complications: Secondary | ICD-10-CM | POA: Diagnosis not present

## 2021-10-13 DIAGNOSIS — I252 Old myocardial infarction: Secondary | ICD-10-CM | POA: Diagnosis not present

## 2021-10-13 DIAGNOSIS — I209 Angina pectoris, unspecified: Secondary | ICD-10-CM | POA: Diagnosis not present

## 2021-10-15 DIAGNOSIS — E119 Type 2 diabetes mellitus without complications: Secondary | ICD-10-CM | POA: Diagnosis not present

## 2021-10-15 DIAGNOSIS — I440 Atrioventricular block, first degree: Secondary | ICD-10-CM | POA: Diagnosis not present

## 2021-10-15 DIAGNOSIS — I252 Old myocardial infarction: Secondary | ICD-10-CM | POA: Diagnosis not present

## 2021-10-15 DIAGNOSIS — I209 Angina pectoris, unspecified: Secondary | ICD-10-CM | POA: Diagnosis not present

## 2021-10-15 DIAGNOSIS — I119 Hypertensive heart disease without heart failure: Secondary | ICD-10-CM | POA: Diagnosis not present

## 2021-10-17 DIAGNOSIS — I440 Atrioventricular block, first degree: Secondary | ICD-10-CM | POA: Diagnosis not present

## 2021-10-17 DIAGNOSIS — I209 Angina pectoris, unspecified: Secondary | ICD-10-CM | POA: Diagnosis not present

## 2021-10-17 DIAGNOSIS — E119 Type 2 diabetes mellitus without complications: Secondary | ICD-10-CM | POA: Diagnosis not present

## 2021-10-17 DIAGNOSIS — I119 Hypertensive heart disease without heart failure: Secondary | ICD-10-CM | POA: Diagnosis not present

## 2021-10-17 DIAGNOSIS — I252 Old myocardial infarction: Secondary | ICD-10-CM | POA: Diagnosis not present

## 2021-10-20 DIAGNOSIS — I119 Hypertensive heart disease without heart failure: Secondary | ICD-10-CM | POA: Diagnosis not present

## 2021-10-20 DIAGNOSIS — I440 Atrioventricular block, first degree: Secondary | ICD-10-CM | POA: Diagnosis not present

## 2021-10-20 DIAGNOSIS — I252 Old myocardial infarction: Secondary | ICD-10-CM | POA: Diagnosis not present

## 2021-10-20 DIAGNOSIS — E119 Type 2 diabetes mellitus without complications: Secondary | ICD-10-CM | POA: Diagnosis not present

## 2021-10-20 DIAGNOSIS — I209 Angina pectoris, unspecified: Secondary | ICD-10-CM | POA: Diagnosis not present

## 2021-10-22 DIAGNOSIS — I209 Angina pectoris, unspecified: Secondary | ICD-10-CM | POA: Diagnosis not present

## 2021-10-22 DIAGNOSIS — I252 Old myocardial infarction: Secondary | ICD-10-CM | POA: Diagnosis not present

## 2021-10-22 DIAGNOSIS — I119 Hypertensive heart disease without heart failure: Secondary | ICD-10-CM | POA: Diagnosis not present

## 2021-10-22 DIAGNOSIS — I440 Atrioventricular block, first degree: Secondary | ICD-10-CM | POA: Diagnosis not present

## 2021-10-22 DIAGNOSIS — E119 Type 2 diabetes mellitus without complications: Secondary | ICD-10-CM | POA: Diagnosis not present

## 2021-10-23 DIAGNOSIS — E119 Type 2 diabetes mellitus without complications: Secondary | ICD-10-CM | POA: Diagnosis not present

## 2021-10-23 DIAGNOSIS — I252 Old myocardial infarction: Secondary | ICD-10-CM | POA: Diagnosis not present

## 2021-10-23 DIAGNOSIS — I440 Atrioventricular block, first degree: Secondary | ICD-10-CM | POA: Diagnosis not present

## 2021-10-23 DIAGNOSIS — I119 Hypertensive heart disease without heart failure: Secondary | ICD-10-CM | POA: Diagnosis not present

## 2021-10-23 DIAGNOSIS — I209 Angina pectoris, unspecified: Secondary | ICD-10-CM | POA: Diagnosis not present

## 2022-04-11 DIAGNOSIS — J1282 Pneumonia due to coronavirus disease 2019: Secondary | ICD-10-CM | POA: Diagnosis not present

## 2022-04-11 DIAGNOSIS — Z1159 Encounter for screening for other viral diseases: Secondary | ICD-10-CM | POA: Diagnosis not present

## 2022-04-11 DIAGNOSIS — R509 Fever, unspecified: Secondary | ICD-10-CM | POA: Diagnosis not present

## 2022-04-11 DIAGNOSIS — U071 COVID-19: Secondary | ICD-10-CM | POA: Diagnosis not present

## 2022-06-10 DIAGNOSIS — R051 Acute cough: Secondary | ICD-10-CM | POA: Diagnosis not present

## 2023-04-27 DIAGNOSIS — H1031 Unspecified acute conjunctivitis, right eye: Secondary | ICD-10-CM | POA: Diagnosis not present

## 2023-07-28 DEATH — deceased
